# Patient Record
Sex: Male | Born: 1950
Health system: Southern US, Community
[De-identification: ages and names within clinical notes are randomized; demographics above are authoritative.]

## PROBLEM LIST (undated history)

## (undated) DIAGNOSIS — R0609 Other forms of dyspnea: Secondary | ICD-10-CM

## (undated) DIAGNOSIS — C61 Malignant neoplasm of prostate: Secondary | ICD-10-CM

## (undated) DIAGNOSIS — E669 Obesity, unspecified: Secondary | ICD-10-CM

## (undated) DIAGNOSIS — G4733 Obstructive sleep apnea (adult) (pediatric): Secondary | ICD-10-CM

## (undated) DIAGNOSIS — F32A Depression, unspecified: Secondary | ICD-10-CM

## (undated) DIAGNOSIS — I10 Essential (primary) hypertension: Secondary | ICD-10-CM

## (undated) DIAGNOSIS — R5383 Other fatigue: Secondary | ICD-10-CM

## (undated) DIAGNOSIS — R0600 Dyspnea, unspecified: Secondary | ICD-10-CM

## (undated) DIAGNOSIS — M1990 Unspecified osteoarthritis, unspecified site: Secondary | ICD-10-CM

## (undated) DIAGNOSIS — E119 Type 2 diabetes mellitus without complications: Secondary | ICD-10-CM

## (undated) DIAGNOSIS — R972 Elevated prostate specific antigen [PSA]: Secondary | ICD-10-CM

## (undated) DIAGNOSIS — R0789 Other chest pain: Secondary | ICD-10-CM

## (undated) DIAGNOSIS — F419 Anxiety disorder, unspecified: Secondary | ICD-10-CM

## (undated) DIAGNOSIS — Z889 Allergy status to unspecified drugs, medicaments and biological substances status: Secondary | ICD-10-CM

## (undated) DIAGNOSIS — N529 Male erectile dysfunction, unspecified: Secondary | ICD-10-CM

## (undated) HISTORY — DX: Other forms of dyspnea: R06.09

## (undated) HISTORY — PX: EYE SURGERY: SHX253

## (undated) HISTORY — DX: Anxiety disorder, unspecified: F41.9

## (undated) HISTORY — DX: Dyspnea, unspecified: R06.00

## (undated) HISTORY — DX: Male erectile dysfunction, unspecified: N52.9

## (undated) HISTORY — PX: PROSTATE BIOPSY: SHX241

## (undated) HISTORY — DX: Allergy status to unspecified drugs, medicaments and biological substances: Z88.9

## (undated) HISTORY — PX: TONSILLECTOMY: SUR1361

## (undated) HISTORY — DX: Obstructive sleep apnea (adult) (pediatric): G47.33

## (undated) HISTORY — DX: Elevated prostate specific antigen (PSA): R97.20

## (undated) HISTORY — DX: Other fatigue: R53.83

## (undated) HISTORY — DX: Obesity, unspecified: E66.9

## (undated) HISTORY — DX: Other chest pain: R07.89

## (undated) HISTORY — DX: Essential (primary) hypertension: I10

## (undated) HISTORY — PX: CATARACT EXTRACTION, BILATERAL: SHX1313

---

## 1993-01-18 HISTORY — PX: APPENDECTOMY: SHX54

## 1997-07-14 ENCOUNTER — Emergency Department (HOSPITAL_COMMUNITY): Admission: EM | Admit: 1997-07-14 | Discharge: 1997-07-14 | Payer: Self-pay | Admitting: Emergency Medicine

## 2003-11-11 ENCOUNTER — Ambulatory Visit (HOSPITAL_COMMUNITY): Admission: RE | Admit: 2003-11-11 | Discharge: 2003-11-11 | Payer: Self-pay | Admitting: Gastroenterology

## 2008-01-19 HISTORY — PX: EYE SURGERY: SHX253

## 2010-02-17 ENCOUNTER — Encounter: Payer: Self-pay | Admitting: Internal Medicine

## 2010-07-19 DIAGNOSIS — R972 Elevated prostate specific antigen [PSA]: Secondary | ICD-10-CM

## 2010-07-19 HISTORY — DX: Elevated prostate specific antigen (PSA): R97.20

## 2010-09-11 ENCOUNTER — Ambulatory Visit (HOSPITAL_BASED_OUTPATIENT_CLINIC_OR_DEPARTMENT_OTHER): Payer: BC Managed Care – PPO

## 2010-10-30 ENCOUNTER — Encounter (HOSPITAL_BASED_OUTPATIENT_CLINIC_OR_DEPARTMENT_OTHER): Payer: BC Managed Care – PPO

## 2010-11-06 ENCOUNTER — Ambulatory Visit (HOSPITAL_BASED_OUTPATIENT_CLINIC_OR_DEPARTMENT_OTHER): Payer: BC Managed Care – PPO | Attending: Family Medicine

## 2010-11-06 DIAGNOSIS — G4733 Obstructive sleep apnea (adult) (pediatric): Secondary | ICD-10-CM | POA: Insufficient documentation

## 2010-11-06 DIAGNOSIS — G4761 Periodic limb movement disorder: Secondary | ICD-10-CM | POA: Insufficient documentation

## 2010-11-14 DIAGNOSIS — G4761 Periodic limb movement disorder: Secondary | ICD-10-CM

## 2010-11-14 DIAGNOSIS — G4733 Obstructive sleep apnea (adult) (pediatric): Secondary | ICD-10-CM

## 2010-11-14 DIAGNOSIS — R0989 Other specified symptoms and signs involving the circulatory and respiratory systems: Secondary | ICD-10-CM

## 2010-11-14 DIAGNOSIS — R0609 Other forms of dyspnea: Secondary | ICD-10-CM

## 2010-11-14 NOTE — Procedures (Signed)
NAME:  Richard Pollard, Richard Pollard NO.:  [id number]  MEDICAL RECORD NO.:  [id number]          PATIENT TYPE:  OUT  LOCATION:  SLEEP CENTER                 FACILITY:  [facility]  PHYSICIAN:  Zarina Pe D. Maple Hudson, MD, FCCP, FACPDATE OF BIRTH:  1950-08-[birth year]  DATE OF STUDY:  11/06/2010                           NOCTURNAL POLYSOMNOGRAM  REFERRING PHYSICIAN:  Marjory Lies, M.D.  REFERRING DOCTOR:  Marjory Lies, MD  INDICATION FOR STUDY:  Hypersomnia with sleep apnea.  EPWORTH SLEEPINESS SCORE:  10/24.  BMI 35.9, weight 265 pounds, height 72 inches, neck 16 inches.  MEDICATIONS:  Home medications are charted and reviewed.  SLEEP ARCHITECTURE:  Total sleep time 364.5 minutes with sleep efficiency 80%.  Stage I was 26.1%.  Stage II 56.1%.  Stage III absent. REM 17.8% of total sleep time.  Sleep latency 9.5 minutes.  REM latency 120 minutes.  Awake after sleep onset 79 minutes.  Arousal index 25.  Bedtime Medication:  None.  RESPIRATORY DATA:  Apnea-hypopnea index (AHI) 16.6 per hour.  A total of 101 respiratory events were counted of which 40 were obstructive sleep apneas, 18 central apneas, 35 hypopneas.  Events were not positional. REM AHI 16.6 per hour.  There were insufficient numbers of early events to permit initiation of CPAP titration protocol on this study night. Technician observed significant apnea events particularly while lying supine.  OXYGEN DATA:  Very loud snoring with oxygen desaturation to a nadir of 88% and a mean oxygen saturation through the study of 93.8% on room air.  CARDIAC DATA:  Normal sinus rhythm.  MOVEMENT-PARASOMNIA:  Significant periodic limb movement with sleep disturbance.  A total of 341 limb jerks were counted of which 24 were associated with arousals or awakening for periodic limb movement with arousal index of 4 per hour.  No bathroom trips.  IMPRESSIONS-RECOMMENDATIONS: 1. Sleep was fragmented by frequent brief awakenings throughout the  study. 2. Moderate obstructive sleep apnea/hypopnea syndrome, AHI 16.6 per     hour.  Very loud snoring with oxygen desaturation to a nadir of 88%     and a mean oxygen saturation through the study of 93.8% on room     air. Consider return for CPAP titration. 3. Significant periodic limb movement with arousal syndrome.  A total     of 341 limb jerks were counted of which 24 were associated with     arousals or awakening for periodic limb movement with arousal index     of 4 per hour.  This pattern predominated until apnea events became     more common in the latter half of the night. It is likely both     forms of sleep disturbance will respond to treatment.     Lorine Iannaccone D. Maple Hudson, MD, [facility], FACP Diplomate, Biomedical engineer of Sleep Medicine Electronically Signed    CDY/MEDQ  D:  11/14/2010 11:24:08  T:  11/14/2010 12:41:56  Job:  [id number]

## 2011-01-01 ENCOUNTER — Encounter: Payer: Self-pay | Admitting: *Deleted

## 2011-01-01 ENCOUNTER — Encounter: Payer: Self-pay | Admitting: Internal Medicine

## 2011-01-01 ENCOUNTER — Ambulatory Visit (INDEPENDENT_AMBULATORY_CARE_PROVIDER_SITE_OTHER): Payer: BC Managed Care – PPO | Admitting: Internal Medicine

## 2011-01-01 DIAGNOSIS — R0789 Other chest pain: Secondary | ICD-10-CM

## 2011-01-01 DIAGNOSIS — R0989 Other specified symptoms and signs involving the circulatory and respiratory systems: Secondary | ICD-10-CM

## 2011-01-01 DIAGNOSIS — R0609 Other forms of dyspnea: Secondary | ICD-10-CM

## 2011-01-01 DIAGNOSIS — G4733 Obstructive sleep apnea (adult) (pediatric): Secondary | ICD-10-CM

## 2011-01-01 MED ORDER — FUROSEMIDE 20 MG PO TABS: ORAL_TABLET | ORAL | Status: DC

## 2011-01-01 MED ORDER — LISINOPRIL 20 MG PO TABS: 20.0000 mg | ORAL_TABLET | Freq: Every day | ORAL | Status: DC

## 2011-01-01 NOTE — Assessment & Plan Note (Addendum)
The patient has significant dyspnea on exertion that has been progressive over last 6-8 months. This is accompanied by peripheral edema. We'll likely discussion about the issues of heart failure with either preserved or depressed left ventricular function. From a therapeutic vantage point we will begin antihypertensive therapy with an ACE inhibitor and begin a diuretic using Lasix for about 2 weeks with the hope of the 5-10 weight loss. We have also discussed the importance of a low sodium diet  From a diagnostic point of view, understanding left ventricular function and the potential role ischemia is important with his family history and cardiac risk factors and will undertake echo and Myoview scanning his habitus being inadequate for stress echo.  I also think that it is important for him to pursue his therapy for sleep apnea at this point.

## 2011-01-01 NOTE — Assessment & Plan Note (Signed)
His chest tightness while somewhat atypical still is within intermediate-high pretest probability for coronary disease. Will undertake stress Myoview scanning

## 2011-01-01 NOTE — Assessment & Plan Note (Addendum)
I recommended that he pursue therapy for this in Parallel with the aforementioned evaluation

## 2011-01-01 NOTE — Progress Notes (Signed)
  HPI  Richard Pollard is a 60 y.o. male Seen at the request of Dr. Doristine Counter because of progressive dyspnea on exertion in the setting of peripheral edema.  He has no known history of heart disease. He has had some vague chest discomforts over the recent months that are primarily associated with mild or moderate degrees of exertion. They're unaccompanied by other epiphenomena.  He has also noted progressive dyspnea on exertion over the last 6 months please no limited to less than one flight of stairs. In the same interval he has had problems with peripheral edema. He has had no palpitations orthopnea or nocturnal dyspnea.  He's also been evaluated into currently for sleep apnea. His AHI was 17; therapy was recommended but initially she was deferred until this evaluation.  Over recent months he's also had problems with nausea and vomiting with nocturnal reflux symptoms i.e. Chest discomfort. There've also been vague episodes of weakness  Past Medical History  Diagnosis Date  . OSA (obstructive sleep apnea)   . Chest tightness   . Sleep disturbance   . Obesity   . Anxiety   . PSA elevation 07/2010  . Erectile dysfunction   . Fatigue   . Constipation   . Multiple allergies   . Breathing problem     Past Surgical History  Procedure Date  . Appendectomy 1995  . Eye surgery 2010  . Tonsillectomy     childhood    Current Outpatient Prescriptions  Medication Sig Dispense Refill  . aspirin 325 MG tablet Take 325 mg by mouth daily.        . fish oil-omega-3 fatty acids 1000 MG capsule Take 2 g by mouth daily.        Marland Kitchen FLUTICASONE PROPIONATE  HFA IN Inhale into the lungs daily.        . Misc Natural Products (GLUCOSAMINE CHOND COMPLEX/MSM) TABS Take 1 tablet by mouth daily.        . Multiple Vitamins-Minerals (MULTIVITAMIN WITH MINERALS) tablet Take 1 tablet by mouth daily.        . sertraline (ZOLOFT) 25 MG tablet Take 25 mg by mouth daily.        . Vardenafil HCl (LEVITRA PO) Take by  mouth as needed.        . vitamin E 1000 UNIT capsule Take 1,000 Units by mouth daily.         Family History  Problem Relation Age of Onset  . Coronary artery disease    . Heart attack Father 39    after Bypass surgery at age 66  . 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

## 2011-01-01 NOTE — Patient Instructions (Signed)
Your physician has recommended you make the following change in your medication:  1) Take lasix (furosemide) 20 mg one tablet daily for 2 weeks, then stop. 2) Start lisinopril 20 mg one tablet daily.  Your physician has requested that you have en exercise stress myoview. For further information please visit [email or web address] Please follow instruction sheet, as given.  Your physician has requested that you have an echocardiogram. Echocardiography is a painless test that uses sound waves to create images of your heart. It provides your doctor with information about the size and shape of your heart and how well your heart's chambers and valves are working. This procedure takes approximately one hour. There are no restrictions for this procedure.  Your physician recommends that you schedule a follow-up appointment in: 4 weeks.

## 2011-01-07 ENCOUNTER — Ambulatory Visit (HOSPITAL_BASED_OUTPATIENT_CLINIC_OR_DEPARTMENT_OTHER): Payer: BC Managed Care – PPO | Admitting: Radiology

## 2011-01-07 ENCOUNTER — Ambulatory Visit (HOSPITAL_COMMUNITY): Payer: BC Managed Care – PPO | Attending: Internal Medicine | Admitting: Radiology

## 2011-01-07 DIAGNOSIS — Z8249 Family history of ischemic heart disease and other diseases of the circulatory system: Secondary | ICD-10-CM | POA: Insufficient documentation

## 2011-01-07 DIAGNOSIS — R0609 Other forms of dyspnea: Secondary | ICD-10-CM

## 2011-01-07 DIAGNOSIS — I10 Essential (primary) hypertension: Secondary | ICD-10-CM | POA: Insufficient documentation

## 2011-01-07 DIAGNOSIS — R0789 Other chest pain: Secondary | ICD-10-CM

## 2011-01-07 DIAGNOSIS — R0602 Shortness of breath: Secondary | ICD-10-CM

## 2011-01-07 DIAGNOSIS — G4733 Obstructive sleep apnea (adult) (pediatric): Secondary | ICD-10-CM

## 2011-01-07 DIAGNOSIS — E669 Obesity, unspecified: Secondary | ICD-10-CM | POA: Insufficient documentation

## 2011-01-07 DIAGNOSIS — Z87891 Personal history of nicotine dependence: Secondary | ICD-10-CM | POA: Insufficient documentation

## 2011-01-07 DIAGNOSIS — R079 Chest pain, unspecified: Secondary | ICD-10-CM

## 2011-01-07 DIAGNOSIS — R0989 Other specified symptoms and signs involving the circulatory and respiratory systems: Secondary | ICD-10-CM | POA: Insufficient documentation

## 2011-01-07 MED ORDER — TECHNETIUM TC 99M TETROFOSMIN IV KIT
10.0000 | PACK | Freq: Once | INTRAVENOUS | Status: AC | PRN
  Administered 2011-01-07: 10 via INTRAVENOUS

## 2011-01-07 MED ORDER — TECHNETIUM TC 99M TETROFOSMIN IV KIT
30.0000 | PACK | Freq: Once | INTRAVENOUS | Status: AC | PRN
  Administered 2011-01-07: 30 via INTRAVENOUS

## 2011-01-07 NOTE — Progress Notes (Signed)
[facility] SITE 3 NUCLEAR MED [address] [place] [address] [phone]  Cardiology Nuclear Med Study  CHUKWUEBUKA CHURCHILL is a 60 y.o. male [id number] 11-06-1950   Nuclear Med Background Indication for Stress Test:  Evaluation for Ischemia History:  No previous documented CAD Cardiac Risk Factors: Family History - CAD, History of Smoking, Hypertension and Obesity  Symptoms:  Chest Tightness (last episode of chest discomfort was about one week ago) and DOE   Nuclear Pre-Procedure Caffeine/Decaff Intake:  None NPO After: 8:00am   Lungs:  Clear.  O2 SAT 98% on RA IV 0.9% NS with Angio Cath:  22g  IV Site: R Hand  IV Started by:  Stanton Kidney, EMT-P  Chest Size (in):  48 Cup Size: n/a  Height: 6' (1.829 m)  Weight:  282 lb (127.914 kg)  BMI:  Body mass index is 38.25 kg/(m^2). Tech Comments:  AM medications taken today, per patient.    Nuclear Med Study 1 or 2 day study: 2 day  Stress Test Type:  Stress  Reading MD: Olga Millers, MD  Order Authorizing Provider:  Berton Mount, MD  Resting Radionuclide: Technetium 59m Tetrofosmin  Resting Radionuclide Dose: 11.0 mCi   Stress Radionuclide:  Technetium 66m Tetrofosmin  Stress Radionuclide Dose: 33.0 mCi           Stress Protocol Rest HR: 71 Stress HR: 151  Rest BP: Sitting:135/93  Standing:137/84 Stress BP: 238/80  Exercise Time (min): 5:46 METS: 7.0   Predicted Max HR: 162 bpm % Max HR: 93.21 bpm Rate Pressure Product: 46962   Dose of Adenosine (mg):  n/a Dose of Lexiscan: n/a mg  Dose of Atropine (mg): n/a Dose of Dobutamine: n/a mcg/kg/min (at max HR)  Stress Test Technologist: Smiley Houseman, CMA-N  Nuclear Technologist:  Domenic Polite, CNMT     Rest Procedure:  Myocardial perfusion imaging was performed at rest 45 minutes following the intravenous administration of Technetium 72m Tetrofosmin.Marland Kitchen  Rest ECG: No acute changes.  Stress Procedure:  The patient exercised for 5:46 on the treadmill  utilizing the Bruce protocol.  The patient stopped due to significant dyspnea and denied any chest pain.  There were no diagnostic ST-T wave changes, only rare PVC's and PAC's.  He did have a hypertensive response to exercise, 238/80.  Technetium 15m Tetrofosmin was injected at peak exercise and myocardial perfusion imaging was performed after a brief delay.  Stress ECG: No significant ST segment change suggestive of ischemia.  QPS Raw Data Images:  Acquisition technically good; normal left ventricular size. Stress Images:  Normal homogeneous uptake in all areas of the myocardium. Rest Images:  Normal homogeneous uptake in all areas of the myocardium. Subtraction (SDS):  No evidence of ischemia. Transient Ischemic Dilatation (Normal <1.22):  1.05 Lung/Heart Ratio (Normal <0.45):  0.28  Quantitative Gated Spect Images QGS EDV:  92 ml QGS ESV:  31 ml QGS cine images:  NL LV Function; NL Wall Motion QGS EF: 66%  Impression Exercise Capacity:  Fair exercise capacity. BP Response:  Hypertensive blood pressure response (? Decreased BP in stage 2 but felt most likely not to be accurate). Clinical Symptoms:  No chest pain. ECG Impression:  No significant ST segment change suggestive of ischemia. Comparison with Prior Nuclear Study: No previous nuclear study performed  Overall Impression:  Normal stress nuclear study.   Olga Millers

## 2011-01-26 ENCOUNTER — Telehealth: Payer: Self-pay | Admitting: *Deleted

## 2011-01-26 NOTE — Telephone Encounter (Signed)
I called the patient with his echo and myoview results. He states that he still has SOB. He was given a prescription by Dr. Graciela Husbands to try lasix 20mg  once daily for ankle edema. He states he is still on this and has noticed no change in the amount of edema or in the amount of urination. I informed him I would review with Dr. Graciela Husbands to see if he recommends increasing the dose of lasix to 40 mg daily to see if he notices an improvement with this. He has a follow up scheduled with Dr. Graciela Husbands for 02/16/11 at 4pm. I will call him back.

## 2011-01-29 NOTE — Telephone Encounter (Signed)
I left a message for the patient to call. 

## 2011-01-29 NOTE — Telephone Encounter (Signed)
I agree. The echo and the Myoview were normal. Salt restriction and increasing diuretics would be helpful. Let's try 40 daily for a week. If that does not help he can increase to 60 mg a day in anticipation of seeing [place]

## 2011-02-04 NOTE — Telephone Encounter (Signed)
I spoke with the patient. He is having some intermittent sob. He will increase lasix to 40 mg daily for a week per Dr. Odessa Fleming recommendations, and up to 60 mg a day if that does not help. We will see him back on 02/16/11. He is agreeable and voices understanding.

## 2011-02-16 ENCOUNTER — Encounter: Payer: Self-pay | Admitting: Internal Medicine

## 2011-02-16 ENCOUNTER — Ambulatory Visit (INDEPENDENT_AMBULATORY_CARE_PROVIDER_SITE_OTHER): Payer: BC Managed Care – PPO | Admitting: Internal Medicine

## 2011-02-16 DIAGNOSIS — I110 Hypertensive heart disease with heart failure: Secondary | ICD-10-CM | POA: Insufficient documentation

## 2011-02-16 DIAGNOSIS — R609 Edema, unspecified: Secondary | ICD-10-CM

## 2011-02-16 DIAGNOSIS — R0609 Other forms of dyspnea: Secondary | ICD-10-CM

## 2011-02-16 DIAGNOSIS — G4733 Obstructive sleep apnea (adult) (pediatric): Secondary | ICD-10-CM

## 2011-02-16 DIAGNOSIS — R0989 Other specified symptoms and signs involving the circulatory and respiratory systems: Secondary | ICD-10-CM

## 2011-02-16 DIAGNOSIS — I10 Essential (primary) hypertension: Secondary | ICD-10-CM

## 2011-02-16 MED ORDER — LOSARTAN POTASSIUM 50 MG PO TABS: 50.0000 mg | ORAL_TABLET | Freq: Every day | ORAL | Status: DC

## 2011-02-16 MED ORDER — FUROSEMIDE 40 MG PO TABS: ORAL_TABLET | ORAL | Status: DC

## 2011-02-16 NOTE — Assessment & Plan Note (Signed)
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 We will augment his diuresis initially  To 40 mg daily and then to 80 mg as necessary. He will followup with Dr. Doristine Counter within the next couple of weeks following the in the inception of urination . He will need a metabolic profile at that time

## 2011-02-16 NOTE — Assessment & Plan Note (Signed)
I have encouraged him to follow up again with Dr. Doristine Counter to pursue evaluation of sleep apnea.

## 2011-02-16 NOTE — Progress Notes (Signed)
  HPI  Richard Pollard is a 61 y.o. male Seen in followup for shortness of breath. He underwent a noninvasive evaluation since his last visit in December included an ultrasound demonstrating normal systolic function and mild diastolic abnormalities and otherwise a structurally normal heart and a Myoview scan that was normal.  His breathing was somewhat better with antihypertensive therapy; however, he developed a dry cough. The diuretics did not augment urination.  Past Medical History  Diagnosis Date  . OSA (obstructive sleep apnea)   . Chest tightness   . Dyspnea on exertion   . Obesity   . Anxiety /Depression-mild   . PSA elevation 07/2010  . Erectile dysfunction   . Fatigue   . Constipation   . Multiple allergies     Past Surgical History  Procedure Date  . Appendectomy 1995  . Eye surgery 2010  . Tonsillectomy     childhood    Current Outpatient Prescriptions  Medication Sig Dispense Refill  . aspirin 325 MG tablet Take 325 mg by mouth daily.        . fish oil-omega-3 fatty acids 1000 MG capsule Take 2 g by mouth daily.        Marland Kitchen FLUTICASONE PROPIONATE  HFA IN Inhale into the lungs daily.        . furosemide (LASIX) 20 MG tablet Take one tablet by mouth once daily as directed  30 tablet  0  . lisinopril (PRINIVIL,ZESTRIL) 20 MG tablet Take 1 tablet (20 mg total) by mouth daily.  90 tablet  3  . Misc Natural Products (GLUCOSAMINE CHOND COMPLEX/MSM) TABS Take 1 tablet by mouth daily.        . Multiple Vitamins-Minerals (MULTIVITAMIN WITH MINERALS) tablet Take 1 tablet by mouth daily.        . sertraline (ZOLOFT) 25 MG tablet Take 25 mg by mouth daily.        . Vardenafil HCl (LEVITRA PO) Take by mouth as needed.          No Known Allergies  Review of Systems negative except from HPI and PMH  Physical Exam BP 135/90  Pulse 90  Ht 6' (1.829 m)  Wt 288 lb (130.636 kg)  BMI 39.06 kg/m2 Well developed and well nourished in no acute distress HENT normal E scleral and  icterus clear Neck Supple JVP 6-7 cm; carotids brisk and full Clear to ausculation Regular rate and rhythm, no murmurs gallops or rub Soft with active bowel sounds No clubbing cyanosis 1+ Edema Alert and oriented, grossly normal motor and sensory function Skin Warm and Dry   Assessment and  Plan

## 2011-02-16 NOTE — Assessment & Plan Note (Signed)
Will change his ACE inhibitor to an ARB because of the cough. We will use a diuretic also to help. He'll follow up with his PCP

## 2011-02-16 NOTE — Assessment & Plan Note (Signed)
As above.

## 2011-02-16 NOTE — Patient Instructions (Signed)
Your physician has recommended you make the following change in your medication:  1) Increase lasix to 40 mg one tablet by mouth twice daily. 2) Stop lisinopril. 3) Start losartan (cozaar) 50 mg one tablet by mouth daily.  We will see you back as needed.

## 2011-03-17 ENCOUNTER — Telehealth: Payer: Self-pay | Admitting: Internal Medicine

## 2011-03-17 NOTE — Telephone Encounter (Signed)
All Cardiac faxed to [facility] @ [phone] 03/17/11/KM

## 2011-04-23 ENCOUNTER — Ambulatory Visit (HOSPITAL_BASED_OUTPATIENT_CLINIC_OR_DEPARTMENT_OTHER): Payer: BC Managed Care – PPO | Attending: Family Medicine | Admitting: Radiology

## 2011-04-23 VITALS — Ht 72.0 in | Wt 280.0 lb

## 2011-04-23 DIAGNOSIS — G4733 Obstructive sleep apnea (adult) (pediatric): Secondary | ICD-10-CM

## 2011-04-23 DIAGNOSIS — G4730 Sleep apnea, unspecified: Secondary | ICD-10-CM | POA: Insufficient documentation

## 2011-04-23 DIAGNOSIS — G4761 Periodic limb movement disorder: Secondary | ICD-10-CM | POA: Insufficient documentation

## 2011-04-23 DIAGNOSIS — G4710 Hypersomnia, unspecified: Secondary | ICD-10-CM | POA: Insufficient documentation

## 2011-05-01 DIAGNOSIS — G4730 Sleep apnea, unspecified: Secondary | ICD-10-CM

## 2011-05-01 DIAGNOSIS — G4710 Hypersomnia, unspecified: Secondary | ICD-10-CM

## 2011-05-01 DIAGNOSIS — G4761 Periodic limb movement disorder: Secondary | ICD-10-CM

## 2011-05-01 NOTE — Procedures (Signed)
NAME:  Richard Pollard, Richard Pollard NO.:  [id number]  MEDICAL RECORD NO.:  [id number]          PATIENT TYPE:  OUT  LOCATION:  SLEEP CENTER                 FACILITY:  [facility]  PHYSICIAN:  Treydon Henricks D. Maple Hudson, MD, FCCP, FACPDATE OF BIRTH:  1950-07-20  DATE OF STUDY:  04/23/2011                           NOCTURNAL POLYSOMNOGRAM  REFERRING PHYSICIAN:  Marjory Lies, M.D.  INDICATION FOR STUDY:  Hypersomnia with sleep apnea.  EPWORTH SLEEPINESS SCORE:  7/24.  BMI 38, weight 280 pounds.  Height 72 inches, neck 17 inches.  MEDICATIONS:  Home medications are charted and reviewed.  A baseline diagnostic NPSG on November 06, 2010, recorded AHI of 16.5 per hour.  CPAP titration is requested.  SLEEP ARCHITECTURE:  Total sleep time 378.5 minutes with sleep efficiency 94.7%.  Stage I was 4.5%, stage II 52.7%, stage III absent, REM 42.8% of total sleep time.  Sleep latency 5.5 minutes, REM latency 94.5 minutes.  Awake after sleep onset 16 minutes.  Arousal index 5.2.  Bedtime medication:  None.  RESPIRATORY DATA:  CPAP titration protocol.  CPAP was titrated to 13 CWP, AHI 0.6 per hour.  He wore a wide ResMed Mirage FX mask with heated humidifier.  Snoring was prevented and mean oxygen saturation held 94.1% on room air.  OXYGEN DATA:  CARDIAC DATA:  Normal sinus rhythm.  MOVEMENT-PARASOMNIA:  Frequent limb jerks.  A total of 643 limb jerks were counted of which 13 were associated with arousal or awakening for periodic limb movement arousal index of 2.1 per hour.  No bathroom trips.  IMPRESSIONS-RECOMMENDATIONS: 1. Successful CPAP titration to 13 CWP, AHI 0.6 per hour.  He wore a     wide ResMed Mirage FX mask with heated humidifier.  Snoring was     prevented and mean oxygen saturation held 94.1% on room air. 2. Baseline diagnostic NPSG on November 06, 2010, recorded AHI 16.5 per     hour. 3. Periodic limb movements with arousal.  A total of 643 limb jerks     were counted of which  only 13 were associated with arousal or     awakening for periodic limb movement with arousal index of 2.1 per     hour.  If this     pattern persists as a cause of sleep disturbance in the home     environment after adjustment of CPAP, then specific therapy such as     a trial of Requip or Mirapex might be considered if clinically     appropriate.     Leodan Bolyard D. Maple Hudson, MD, [facility], FACP Diplomate, American Board of Sleep Medicine    CDY/MEDQ  D:  05/01/2011 11:05:42  T:  05/01/2011 13:05:14  Job:  [id number]

## 2012-04-04 ENCOUNTER — Other Ambulatory Visit: Payer: Self-pay | Admitting: Internal Medicine

## 2012-06-11 ENCOUNTER — Emergency Department (HOSPITAL_COMMUNITY): Payer: BC Managed Care – PPO

## 2012-06-11 ENCOUNTER — Emergency Department (HOSPITAL_COMMUNITY)
Admission: EM | Admit: 2012-06-11 | Discharge: 2012-06-11 | Disposition: A | Discharge status: Home / Self Care | Payer: BC Managed Care – PPO | Attending: Emergency Medicine | Admitting: Emergency Medicine

## 2012-06-11 ENCOUNTER — Encounter (HOSPITAL_COMMUNITY): Payer: Self-pay | Admitting: *Deleted

## 2012-06-11 DIAGNOSIS — S43004A Unspecified dislocation of right shoulder joint, initial encounter: Secondary | ICD-10-CM

## 2012-06-11 DIAGNOSIS — Z87448 Personal history of other diseases of urinary system: Secondary | ICD-10-CM | POA: Insufficient documentation

## 2012-06-11 DIAGNOSIS — Y9289 Other specified places as the place of occurrence of the external cause: Secondary | ICD-10-CM | POA: Insufficient documentation

## 2012-06-11 DIAGNOSIS — Z8719 Personal history of other diseases of the digestive system: Secondary | ICD-10-CM | POA: Insufficient documentation

## 2012-06-11 DIAGNOSIS — F329 Major depressive disorder, single episode, unspecified: Secondary | ICD-10-CM | POA: Insufficient documentation

## 2012-06-11 DIAGNOSIS — Z79899 Other long term (current) drug therapy: Secondary | ICD-10-CM | POA: Insufficient documentation

## 2012-06-11 DIAGNOSIS — G4733 Obstructive sleep apnea (adult) (pediatric): Secondary | ICD-10-CM | POA: Insufficient documentation

## 2012-06-11 DIAGNOSIS — Y9389 Activity, other specified: Secondary | ICD-10-CM | POA: Insufficient documentation

## 2012-06-11 DIAGNOSIS — X500XXA Overexertion from strenuous movement or load, initial encounter: Secondary | ICD-10-CM | POA: Insufficient documentation

## 2012-06-11 DIAGNOSIS — F3289 Other specified depressive episodes: Secondary | ICD-10-CM | POA: Insufficient documentation

## 2012-06-11 DIAGNOSIS — S43016A Anterior dislocation of unspecified humerus, initial encounter: Secondary | ICD-10-CM | POA: Insufficient documentation

## 2012-06-11 DIAGNOSIS — F411 Generalized anxiety disorder: Secondary | ICD-10-CM | POA: Insufficient documentation

## 2012-06-11 DIAGNOSIS — E669 Obesity, unspecified: Secondary | ICD-10-CM | POA: Insufficient documentation

## 2012-06-11 DIAGNOSIS — IMO0002 Reserved for concepts with insufficient information to code with codable children: Secondary | ICD-10-CM | POA: Insufficient documentation

## 2012-06-11 DIAGNOSIS — Z8679 Personal history of other diseases of the circulatory system: Secondary | ICD-10-CM | POA: Insufficient documentation

## 2012-06-11 DIAGNOSIS — Z7982 Long term (current) use of aspirin: Secondary | ICD-10-CM | POA: Insufficient documentation

## 2012-06-11 MED ORDER — OXYCODONE-ACETAMINOPHEN 5-325 MG PO TABS: 2.0000 | ORAL_TABLET | ORAL | Status: DC | PRN

## 2012-06-11 MED ORDER — HYDROMORPHONE HCL PF 1 MG/ML IJ SOLN
1.0000 mg | Freq: Once | INTRAMUSCULAR | Status: AC
  Administered 2012-06-11: 1 mg via INTRAVENOUS
  Filled 2012-06-11: qty 1

## 2012-06-11 MED ORDER — PROPOFOL 10 MG/ML IV BOLUS
INTRAVENOUS | Status: AC | PRN
  Administered 2012-06-11: 10 mg via INTRAVENOUS

## 2012-06-11 MED ORDER — KETAMINE HCL 10 MG/ML IJ SOLN: 100.0000 mg | Freq: Once | INTRAMUSCULAR | Status: AC

## 2012-06-11 MED ORDER — METHOCARBAMOL 750 MG PO TABS: 750.0000 mg | ORAL_TABLET | Freq: Four times a day (QID) | ORAL | Status: DC

## 2012-06-11 MED ORDER — PROPOFOL 10 MG/ML IV BOLUS
100.0000 mg | Freq: Once | INTRAVENOUS | Status: AC
  Administered 2012-06-11: 5.5 mg via INTRAVENOUS
  Filled 2012-06-11: qty 1

## 2012-06-11 MED ORDER — KETAMINE HCL 10 MG/ML IJ SOLN
INTRAMUSCULAR | Status: AC | PRN
  Administered 2012-06-11: 10 mg via INTRAVENOUS

## 2012-06-11 NOTE — ED Notes (Signed)
Pt presents to ed with c/o shoulder injury/ pt sts smashed his shoulder between golf cart and roof support that he was working on. There is obvious deformity to right shoulder.

## 2012-06-11 NOTE — ED Provider Notes (Signed)
History     CSN: [id number]  Arrival date & time 06/11/12  1714   First MD Initiated Contact with Patient 06/11/12 1722      Chief Complaint  Patient presents with  . Shoulder Injury    (Consider location/radiation/quality/duration/timing/severity/associated sxs/prior treatment) Patient is a 62 y.o. male presenting with shoulder injury. The history is provided by the patient.  Shoulder Injury   patient complains of right shoulder injury after hyperextending it. Unable to move his arm at this time without severe sharp pain. Denies any numbness to his hand. No prior history of shoulder dislocation. Symptoms better with remaining still and worse with movement. No treatment used prior to arrival  Past Medical History  Diagnosis Date  . OSA (obstructive sleep apnea)   . Chest tightness   . Dyspnea on exertion   . Obesity   . Anxiety /Depression-mild   . PSA elevation 07/2010  . Erectile dysfunction   . Fatigue   . Constipation   . Multiple allergies     Past Surgical History  Procedure Laterality Date  . Appendectomy  1995  . Eye surgery  2010  . Tonsillectomy      childhood    Family History  Problem Relation Age of Onset  . Coronary artery disease    . Heart attack Father 66    after Bypass surgery at age 78  . Other Mother 93    angioplasty    History  Substance Use Topics  . Smoking status: Never Smoker   . Smokeless tobacco: Never Used  . Alcohol Use: Yes      Review of Systems  All other systems reviewed and are negative.    Allergies  Review of patient's allergies indicates no known allergies.  Home Medications   Current Outpatient Rx  Name  Route  Sig  Dispense  Refill  . aspirin 325 MG tablet   Oral   Take 325 mg by mouth daily.           . fish oil-omega-3 fatty acids 1000 MG capsule   Oral   Take 2 g by mouth daily.           Marland Kitchen FLUTICASONE PROPIONATE  HFA IN   Inhalation   Inhale into the lungs daily.           .  furosemide (LASIX) 40 MG tablet      Take one tablet by mouth twice daily.   180 tablet   3   . losartan (COZAAR) 50 MG tablet      TAKE 1 TABLET BY MOUTH DAILY   90 tablet   3   . Misc Natural Products (GLUCOSAMINE CHOND COMPLEX/MSM) TABS   Oral   Take 1 tablet by mouth daily.           . Multiple Vitamins-Minerals (MULTIVITAMIN WITH MINERALS) tablet   Oral   Take 1 tablet by mouth daily.           . sertraline (ZOLOFT) 25 MG tablet   Oral   Take 25 mg by mouth daily.           . Vardenafil HCl (LEVITRA PO)   Oral   Take by mouth as needed.             There were no vitals taken for this visit.  Physical Exam  Nursing note and vitals reviewed. Constitutional: He is oriented to person, place, and time. He appears well-developed and well-nourished.  Non-toxic appearance.  No distress.  HENT:  Head: Normocephalic and atraumatic.  Eyes: Conjunctivae, EOM and lids are normal. Pupils are equal, round, and reactive to light.  Neck: Normal range of motion. Neck supple. No tracheal deviation present. No mass present.  Cardiovascular: Normal rate, regular rhythm and normal heart sounds.  Exam reveals no gallop.   No murmur heard. Pulmonary/Chest: Effort normal and breath sounds normal. No stridor. No respiratory distress. He has no decreased breath sounds. He has no wheezes. He has no rhonchi. He has no rales.  Abdominal: Soft. Normal appearance and bowel sounds are normal. He exhibits no distension. There is no tenderness. There is no rebound and no CVA tenderness.  Musculoskeletal: He exhibits no edema and no tenderness.       Right shoulder: He exhibits decreased range of motion, tenderness and bony tenderness.       Arms: Neurological: He is alert and oriented to person, place, and time. He has normal strength. No cranial nerve deficit or sensory deficit. GCS eye subscore is 4. GCS verbal subscore is 5. GCS motor subscore is 6.  Skin: Skin is warm and dry. No abrasion  and no rash noted.  Psychiatric: He has a normal mood and affect. His speech is normal and behavior is normal.    ED Course  procedural sedation Date/Time: 06/11/2012 7:08 PM Performed by: Toy Baker Authorized by: Lorre Nick T Consent: Verbal consent obtained. written consent obtained. Risks and benefits: risks, benefits and alternatives were discussed Consent given by: patient Patient understanding: patient states understanding of the procedure being performed Patient identity confirmed: verbally with patient and arm band Time out: Immediately prior to procedure a "time out" was called to verify the correct patient, procedure, equipment, support staff and site/side marked as required. Patient sedated: yes Sedation type: moderate (conscious) sedation Sedatives: ketamine and propofol Sedation start date/time: 06/11/2012 6:40 PM Sedation end date/time: 06/11/2012 7:00 PM Patient tolerance: Patient tolerated the procedure well with no immediate complications.  Reduction of dislocation Date/Time: 06/11/2012 7:10 PM Performed by: Toy Baker Authorized by: Lorre Nick T Consent: Verbal consent obtained. written consent obtained. Consent given by: patient Patient understanding: patient states understanding of the procedure being performed Patient identity confirmed: verbally with patient and arm band Time out: Immediately prior to procedure a "time out" was called to verify the correct patient, procedure, equipment, support staff and site/side marked as required. Patient tolerance: Patient tolerated the procedure well with no immediate complications. Comments: Shoulder was reduced using traction   (including critical care time)  Labs Reviewed - No data to display No results found.   No diagnosis found.    MDM  7:55 PM  pts shoulder reduced, he has recovered from his sedation       Toy Baker, MD 06/11/12 Corky Crafts

## 2012-06-11 NOTE — ED Notes (Signed)
Patient is alert and oriented x3.  He was given DC instructions and follow up visit instructions.  Patient gave verbal understanding.  He was DC ambulatory under his own power to home.  V/S stable.  He was not showing any signs of distress on DC 

## 2012-10-09 DIAGNOSIS — Z947 Corneal transplant status: Secondary | ICD-10-CM | POA: Insufficient documentation

## 2012-10-09 HISTORY — DX: Corneal transplant status: Z94.7

## 2013-05-07 ENCOUNTER — Other Ambulatory Visit (HOSPITAL_COMMUNITY): Payer: Self-pay | Admitting: Urology

## 2013-05-07 DIAGNOSIS — R972 Elevated prostate specific antigen [PSA]: Secondary | ICD-10-CM

## 2013-05-25 ENCOUNTER — Ambulatory Visit (HOSPITAL_COMMUNITY)
Admission: RE | Admit: 2013-05-25 | Discharge: 2013-05-25 | Disposition: A | Discharge status: Home / Self Care | Payer: BC Managed Care – PPO | Source: Ambulatory Visit | Attending: Urology | Admitting: Urology

## 2013-06-01 ENCOUNTER — Ambulatory Visit (HOSPITAL_COMMUNITY): Payer: BC Managed Care – PPO

## 2013-06-06 ENCOUNTER — Ambulatory Visit (HOSPITAL_COMMUNITY)
Admission: RE | Admit: 2013-06-06 | Discharge: 2013-06-06 | Disposition: A | Discharge status: Home / Self Care | Payer: BC Managed Care – PPO | Source: Ambulatory Visit | Attending: Urology | Admitting: Urology

## 2013-06-06 DIAGNOSIS — R972 Elevated prostate specific antigen [PSA]: Secondary | ICD-10-CM | POA: Insufficient documentation

## 2013-06-06 LAB — CREATININE, SERUM
CREATININE: 1.21 mg/dL (ref 0.50–1.35)
GFR calc non Af Amer: 63 mL/min — ABNORMAL LOW (ref >90)
GFR, EST AFRICAN AMERICAN: 73 mL/min — AB (ref >90)

## 2013-06-06 MED ORDER — GADOBENATE DIMEGLUMINE 529 MG/ML IV SOLN
20.0000 mL | Freq: Once | INTRAVENOUS | Status: AC | PRN
  Administered 2013-06-06: 20 mL via INTRAVENOUS

## 2015-03-12 DIAGNOSIS — R972 Elevated prostate specific antigen [PSA]: Secondary | ICD-10-CM | POA: Insufficient documentation

## 2015-03-12 DIAGNOSIS — G4700 Insomnia, unspecified: Secondary | ICD-10-CM | POA: Insufficient documentation

## 2015-03-12 DIAGNOSIS — B360 Pityriasis versicolor: Secondary | ICD-10-CM | POA: Insufficient documentation

## 2015-03-12 DIAGNOSIS — J309 Allergic rhinitis, unspecified: Secondary | ICD-10-CM | POA: Insufficient documentation

## 2015-03-12 DIAGNOSIS — F320 Major depressive disorder, single episode, mild: Secondary | ICD-10-CM | POA: Insufficient documentation

## 2015-03-12 DIAGNOSIS — E669 Obesity, unspecified: Secondary | ICD-10-CM | POA: Insufficient documentation

## 2015-03-12 DIAGNOSIS — Z0000 Encounter for general adult medical examination without abnormal findings: Secondary | ICD-10-CM | POA: Insufficient documentation

## 2015-03-12 DIAGNOSIS — N529 Male erectile dysfunction, unspecified: Secondary | ICD-10-CM | POA: Insufficient documentation

## 2015-03-12 DIAGNOSIS — C61 Malignant neoplasm of prostate: Secondary | ICD-10-CM | POA: Insufficient documentation

## 2015-03-12 DIAGNOSIS — J342 Deviated nasal septum: Secondary | ICD-10-CM | POA: Insufficient documentation

## 2015-03-12 DIAGNOSIS — R7301 Impaired fasting glucose: Secondary | ICD-10-CM | POA: Insufficient documentation

## 2015-03-12 HISTORY — DX: Pityriasis versicolor: B36.0

## 2015-04-10 ENCOUNTER — Encounter: Payer: Self-pay | Admitting: Allergy and Immunology

## 2015-04-10 ENCOUNTER — Ambulatory Visit (INDEPENDENT_AMBULATORY_CARE_PROVIDER_SITE_OTHER): Payer: BLUE CROSS/BLUE SHIELD | Admitting: Allergy and Immunology

## 2015-04-10 VITALS — BP 114/78 | HR 90 | Temp 98.0°F | Resp 18 | Ht 71.85 in | Wt 265.4 lb

## 2015-04-10 DIAGNOSIS — R059 Cough, unspecified: Secondary | ICD-10-CM

## 2015-04-10 DIAGNOSIS — R05 Cough: Secondary | ICD-10-CM

## 2015-04-10 DIAGNOSIS — J310 Chronic rhinitis: Secondary | ICD-10-CM | POA: Diagnosis not present

## 2015-04-10 MED ORDER — AZELASTINE-FLUTICASONE 137-50 MCG/ACT NA SUSP: NASAL | Status: DC

## 2015-04-10 NOTE — Progress Notes (Signed)
NEW PATIENT NOTE  RE: DONIS MCAULEY MRN: [id number] DOB: 1950/08/11 [facility] [place] [address] [place] [address] Date of Office Visit: 04/10/2015  Dear Stephens Shire, MD:  I had the pleasure of seeing Richard Pollard today in initial evaluation as you recall-- Subjective:  Richard Pollard is a 65 y.o. male who presents today for Nasal Congestion and Cough  Assessment:   1. Cough,clear lung exam and normal in office spirometry, likely secondary to post-nasal drip.   2. Mixed rhinitis---allergic and non-allergic component.   3.      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 Plan:   Meds ordered this encounter  Medications  . Azelastine-Fluticasone 137-50 MCG/ACT SUSP    Sig: One Spray into the nostril twice daily for stuffy nose or drainage.    Dispense:  23 g    Refill:  5   Patient Instructions  1. Avoidance: Mite and Mold 2. Nasal Spray: Dymista one spray(s) each nostril twice daily for stuffy nose or drainage.  3. Nasal Saline wash prior to Dymista and as needed. 4. Follow up Visit:  6 weeks or sooner if needed.  HPI: Richard Pollard presents to the office with a recurring history since childhood of year round rhinorrhea, congestion, post nasal drip which is more intense each winter and spring, especially when the weather is very cool.  He also describes pollen, outdoors and fluctuant weather patterns are provoking factors to his symptoms.  No known food sensitivities, history of reflux or sinus infections.  He did have evaluation with ENT, Dr. Erik Obey last year and subsequently gave trial of Flonase and Nasacort and several oral antihistamines with only partial relief. He notices occasional thick drainage but no discolored drainage, fever, headache or sore throat.  Denies ED or Urgent care visits, prednisone or antibiotic courses.  Medical History: Past Medical History  Diagnosis Date  . OSA (obstructive sleep apnea)   . Chest tightness   . Dyspnea on  exertion   . Obesity   . Anxiety /Depression-mild   . PSA elevation 07/2010  . Erectile dysfunction   . Fatigue   . Constipation   . Multiple allergies    Surgical History: Past Surgical History  Procedure Laterality Date  . Appendectomy  1995  . Eye surgery  2010  . Tonsillectomy      childhood   Family History: Family History  Problem Relation Age of Onset  . Coronary artery disease    . Heart attack Father 46    after Bypass surgery at age 70  . Other Mother 45    angioplasty  . Allergic rhinitis Mother   . Angioedema Neg Hx   . Asthma Neg Hx   . Eczema Neg Hx   . Immunodeficiency Neg Hx   . Urticaria Neg Hx    Social History: Social History  . Marital Status:     Spouse Name: N/A  . Number of Children: N/A  . Years of Education: N/A   Social History Main Topics  . Smoking status: Former Smoker    Types: Pipe    Start date: 02/09/1994    Quit date: 01/18/1997  . Smokeless tobacco: Never Used     Comment: Occ Pipe Smoker during that time.  . Alcohol Use: 0.0 oz/week    0 Standard drinks or equivalent per week  . Drug Use: No  . 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  Richard Pollard has a current medication list which includes the following prescription(s): aspirin, furosemide, ibuprofen, losartan, glucosamine chond complex/msm.   Drug Allergies: No Known Allergies  Environmental History: Richard Pollard lives in an older townhouse for with carpet floors, with central heat and air; stuffed mattress, non-feather pillow/comforter without humidifier, pets and smokers.   Review of Systems  Constitutional: Negative for fever, weight loss and malaise/fatigue.       Childhood varicella disease.  HENT: Positive for congestion. Negative for ear pain, hearing loss, nosebleeds and sore throat.   Eyes: Negative for discharge and redness.       Corrective eyeglass lenses.  Respiratory:  Negative for shortness of breath.        Denies history of bronchitis and pneumonia.  Gastrointestinal: Negative for heartburn, nausea, vomiting, abdominal pain, diarrhea and constipation.  Genitourinary: Negative.   Musculoskeletal: Negative for myalgias and joint pain.  Skin: Negative.  Negative for itching and rash.  Neurological: Negative.  Negative for dizziness, seizures, weakness and headaches.  Endo/Heme/Allergies: Positive for environmental allergies.       Denies sensitivity to aspirin, NSAIDs, stinging insects, foods, latex, jewelry and cosmetics.  Immunological: No chronic or recurring infections. Objective:   Filed Vitals:   04/10/15 1418  BP: 114/78  Pulse: 90  Temp: 98 F (36.7 C)  Resp: 18   SpO2 Readings from Last 1 Encounters:  04/10/15 95%   Physical Exam  Constitutional: He is well-developed, well-nourished, and in no distress.  HENT:  Head: Atraumatic.  Right Ear: Tympanic membrane and ear canal normal.  Left Ear: Tympanic membrane and ear canal normal.  Nose: Mucosal edema present. No rhinorrhea. No epistaxis.  Mouth/Throat: Oropharynx is clear and moist and mucous membranes are normal. No oropharyngeal exudate, posterior oropharyngeal edema or posterior oropharyngeal erythema.  Eyes: Conjunctivae are normal.  Neck: Neck supple.  Cardiovascular: Normal rate, S1 normal and S2 normal.   No murmur heard. Pulmonary/Chest: Effort normal and breath sounds normal. He has no wheezes. He has no rhonchi. He has no rales.  Abdominal: Soft. Bowel sounds are normal.  Lymphadenopathy:    He has no cervical adenopathy.  Neurological: He is alert.  Skin: Skin is warm and intact. No rash noted. No cyanosis. Nails show no clubbing.   Diagnostics: Spirometry:  FVC 4.87--96%, FEV1 3.84--101%.    Skin testing: Minimal reactivity to selected mold species and dust mite mix--see flowsheet.    Nishaan Stanke M. Ishmael Holter, MD   cc: Stephens Shire, MD

## 2015-04-10 NOTE — Patient Instructions (Signed)
Take Home Sheet  1. Avoidance: Mite and Mold   2. Nasal Spray: Dymista one spray(s) each nostril twice daily for stuffy nose or drainage.     3. Nasal Saline wash prior to Dymista and as needed.   4. Follow up Visit:  6 weeks or sooner if needed.   Websites that have reliable Patient information: 1. American Academy of Asthma, Allergy, & Immunology: www.aaaai.org 2. Food Allergy Network: www.foodallergy.org 3. Mothers of Asthmatics: www.aanma.org 4. [place]: DiningCalendar.de 5. American College of Allergy, Asthma, & Immunology: https://robertson.info/ or www.acaai.org

## 2015-05-27 DIAGNOSIS — Z889 Allergy status to unspecified drugs, medicaments and biological substances status: Secondary | ICD-10-CM | POA: Insufficient documentation

## 2015-05-27 DIAGNOSIS — R5383 Other fatigue: Secondary | ICD-10-CM | POA: Insufficient documentation

## 2015-05-27 DIAGNOSIS — M7050 Other bursitis of knee, unspecified knee: Secondary | ICD-10-CM | POA: Insufficient documentation

## 2015-05-27 DIAGNOSIS — G479 Sleep disorder, unspecified: Secondary | ICD-10-CM | POA: Insufficient documentation

## 2016-02-13 MED FILL — TERBINAFINE HCL 250 MG TAB: 250 | 30 days supply | Qty: 30 | Fill #0

## 2016-12-30 MED FILL — MONTELUKAST SOD 10 MG TAB: 10 | 30 days supply | Qty: 30 | Fill #0

## 2017-02-01 MED FILL — MONTELUKAST SOD 10 MG TAB: 10 | 30 days supply | Qty: 30 | Fill #0

## 2017-02-07 ENCOUNTER — Encounter: Payer: Self-pay | Admitting: Neurology

## 2017-02-09 ENCOUNTER — Ambulatory Visit (INDEPENDENT_AMBULATORY_CARE_PROVIDER_SITE_OTHER): Payer: BLUE CROSS/BLUE SHIELD | Admitting: Neurology

## 2017-02-09 ENCOUNTER — Encounter: Payer: Self-pay | Admitting: Neurology

## 2017-02-09 VITALS — BP 111/71 | HR 91 | Ht 71.0 in | Wt 288.5 lb

## 2017-02-09 DIAGNOSIS — R0683 Snoring: Secondary | ICD-10-CM | POA: Insufficient documentation

## 2017-02-09 DIAGNOSIS — G4733 Obstructive sleep apnea (adult) (pediatric): Secondary | ICD-10-CM | POA: Diagnosis not present

## 2017-02-09 DIAGNOSIS — Z9114 Patient's other noncompliance with medication regimen: Secondary | ICD-10-CM | POA: Diagnosis not present

## 2017-02-09 DIAGNOSIS — G4700 Insomnia, unspecified: Secondary | ICD-10-CM | POA: Insufficient documentation

## 2017-02-09 DIAGNOSIS — I10 Essential (primary) hypertension: Secondary | ICD-10-CM | POA: Diagnosis not present

## 2017-02-09 DIAGNOSIS — J0120 Acute ethmoidal sinusitis, unspecified: Secondary | ICD-10-CM | POA: Insufficient documentation

## 2017-02-09 DIAGNOSIS — G4731 Primary central sleep apnea: Secondary | ICD-10-CM | POA: Insufficient documentation

## 2017-02-09 DIAGNOSIS — G4730 Sleep apnea, unspecified: Secondary | ICD-10-CM | POA: Diagnosis not present

## 2017-02-09 DIAGNOSIS — Z6841 Body Mass Index (BMI) 40.0 and over, adult: Secondary | ICD-10-CM

## 2017-02-09 NOTE — Progress Notes (Signed)
SLEEP MEDICINE CLINIC   Provider:  Larey Pollard, M D  Primary Care Physician:  Richard Shire, MD   Referring Provider: Stephens Shire, MD  Richard Memos, PA.  Chief Complaint  Patient presents with  . New Patient (Initial Visit)    Patient had a sleep study in 2012, he does not currently use a CPAP machine.   Patient here alone, had a sleep study at [facility] in 04-23-2011, this very abnormal sleep architecture, the only sleep study that is available to me here today is his CPAP titration, not the study he was diagnosed with OSA.  Both at same lab.  He gave up CPAP over 2-1/2 years ago, after struggling with air leaks using a full facemask.    HPI:  Richard Pollard is a 67 y.o. male , seen here  in a referral from Richard Pollard  at [facility], later [place], now [facility] in [place].   02-09-2017 ,CD Consult ;  Richard Pollard reports that he was diagnosed with obstructive sleep apnea after study at [facility], was asked to return for CPAP titration but I do not have a physician's interpretation of either study available.  He was issued a CPAP at the time and use it for about 2 years but was increasing struggles.  He has full facial hair, and is a mouth breather and the only option of a full facemask did not allow a good air seal. His CPAP data report again this is not the physician's interpretation, documented an AHI of 12.7 during the titration, there were a low index of 2.1, the longest hypopnea was 28.7 seconds the longest apnea was 21 seconds, his apnea and hypopnea index did not state what the baseline was.  He did have a lot of REM sleep rebounding under CPAP apparently this is why I called is an abnormal sleep architecture. He had a very good sleep efficiency under CPAP.   His AHI at 5 cm water baseline pressure for a total sleep time of only 18.5  minutes was 100.5 /hr. ! He recalled dreaming a lot on CPAP .   The patient reports that he has  lifelong struggle with sinus problems, and that these were aggravated as he used CPAP.  The DME on [address] was not helpful, he was not instructed on how to maintain the machine, cleaning, how to adjust the humidifier. He felt congested, had sinus inflammation, was evaluated by allergy specialist and ENT physicians and could not find a lasting improvement.  Even today he has a slightly congested nasal passage.  [name] feels that it is time to revisit the apnea and possible therapies, as his wife recently was diagnosed and is doing excellent with CPAP.  He has also noticed that the machines are quite advanced in comparison.  Sleep habits are as follows: He goes to bed between 10 and 11, he shares a bedroom with his wife, the bedroom is cool, quiet and dark.  He sleeps on 2 pillows, and on his sides.  His wife has noticed that he still snores and has apnea.  He does not have nocturia.  Once asleep he can stay asleep.  He rises in the morning at 4 AM, and feels often awake before the alarm rings.  Some nights he will have a very fragmented sleep other nights he will sleep soundly. He falls asleep rather promptly.  He reports cycles of 203 night duration when he has very  vivid and lucid dreams and seems to have a lot of dreaming alternating with sometimes weeks and months of not having these experiences.  He does not feel necessarily restored and refreshed after sleep.  He does not report a dry mouth,  Sleep medical history and family sleep history:   Sister has good sleep, parents were healthy and slim , the patient's father had a quadruple bypass and died 52 years later in his early 64s. tonsillectomy at age 28. No sinus, neck surgery, TBI or autoimmune disease.  Obesity since age 19 .  The patient has always had poor pulmonary function capacity. Carries a diagnosis of hypertension, morbid obesity, malaise and fatigue, has history of respiratory and renal rhinitis due to allergies, deviated septum,  sinusitis, prostate cancer, insomnia in the past as well as hypersomnia, untreated obstructive sleep apnea, hypertensive heart disease with congestive heart failure, status post corneal transplant, Fuch's.    Social history:  Works in [employer] account planner.  married, wife uses CPAP.  No children. Day shift starts at 7 AM until 3 Pm. Smoker, quit 1999. Etoh : social wine drinker, less than one a week.  Caffeine-he considers himself a diet soda junkie, drinking about a liter a day, diet Pepsi. No exercise.    Review of Systems: Out of a complete 14 system review, the patient complains of only the following symptoms, and all other reviewed systems are negative. See above,  Epworth score  12 , Fatigue severity score 32 , depression score n/a    Social History   Socioeconomic History  . Marital status: Divorced    Spouse name: Not on file  . Number of children: Not on file  . Years of education: Not on file  . Highest education level: Not on file  Social Needs  . Financial resource strain: Not on file  . Food insecurity - worry: Not on file  . Food insecurity - inability: Not on file  . Transportation needs - medical: Not on file  . Transportation needs - non-medical: Not on file  Occupational History  . Not on file  Tobacco Use  . Smoking status: Former Smoker    Types: Pipe    Start date: 02/09/1994    Last attempt to quit: 01/18/1997    Years since quitting: 20.0  . Smokeless tobacco: Never Used  . Tobacco comment: Occ Pipe Smoker during that time.  Substance and Sexual Activity  . Alcohol use: Yes    Alcohol/week: 0.0 oz  . Drug use: No  . Sexual activity: Not on file  Other Topics Concern  . Not on file  Social History Narrative  . Not on file    Family History  Problem Relation Age of Onset  . Heart attack Father 42       after Bypass surgery at age 73  . Other Mother 83       angioplasty  . Allergic rhinitis Mother   . Coronary artery disease  Unknown   . Angioedema Neg Hx   . Asthma Neg Hx   . Eczema Neg Hx   . Immunodeficiency Neg Hx   . Urticaria Neg Hx     Past Medical History:  Diagnosis Date  . Anxiety /Depression-mild   . Chest tightness   . Constipation   . Dyspnea on exertion   . Erectile dysfunction   . Fatigue   . Hypertension   . Multiple allergies   . Obesity   . OSA (obstructive sleep apnea)   .  PSA elevation 07/2010    Past Surgical History:  Procedure Laterality Date  . APPENDECTOMY  1995  . EYE SURGERY  2010  . TONSILLECTOMY     childhood    Current Outpatient Medications  Medication Sig Dispense Refill  . buPROPion (WELLBUTRIN SR) 150 MG 12 hr tablet Take by mouth.    . furosemide (LASIX) 40 MG tablet Take 40 mg by mouth 2 (two) times daily. Take one tablet by mouth twice daily.    Marland Kitchen losartan (COZAAR) 50 MG tablet TAKE 1 TABLET BY MOUTH DAILY 90 tablet 3  . montelukast (SINGULAIR) 10 MG tablet Take 10 mg by mouth at bedtime.    Marland Kitchen aspirin 325 MG tablet Take 325 mg by mouth 3 (three) times a week.     . Azelastine-Fluticasone 137-50 MCG/ACT SUSP One Spray into the nostril twice daily for stuffy nose or drainage. 23 g 5  . fexofenadine (ALLEGRA) 180 MG tablet Take 180 mg by mouth.    . fish oil-omega-3 fatty acids 1000 MG capsule Take 3 g by mouth daily. Reported on 04/10/2015    . fluticasone (FLONASE) 50 MCG/ACT nasal spray Place 2 sprays into the nose daily. Reported on 04/10/2015    . Glucosamine-Chondroitin 500-400 MG CAPS Take by mouth.    Marland Kitchen ibuprofen (ADVIL,MOTRIN) 200 MG tablet Take 800 mg by mouth every 6 (six) hours as needed for pain.    . methocarbamol (ROBAXIN-750) 750 MG tablet Take 1 tablet (750 mg total) by mouth 4 (four) times daily. (Patient not taking: Reported on 04/10/2015) 30 tablet 0  . Misc Natural Products (GLUCOSAMINE CHOND COMPLEX/MSM) TABS Take 1 tablet by mouth daily.      . Multiple Vitamins-Minerals (MULTIVITAMIN WITH MINERALS) tablet Take 1 tablet by mouth daily.  Reported on 04/10/2015    . oxyCODONE-acetaminophen (PERCOCET/ROXICET) 5-325 MG per tablet Take 2 tablets by mouth every 4 (four) hours as needed for pain. (Patient not taking: Reported on 04/10/2015) 20 tablet 0  . sertraline (ZOLOFT) 25 MG tablet Take 25 mg by mouth daily. Reported on 04/10/2015    . Vardenafil HCl (LEVITRA PO) Take by mouth as needed. Reported on 04/10/2015     No current facility-administered medications for this visit.     Allergies as of 02/09/2017  . (No Known Allergies)    Vitals: BP 111/71   Pulse 91   Ht 5\' 11"  (1.803 m)   Wt 288 lb 8 oz (130.9 kg)   BMI 40.24 kg/m  Last Weight:  Wt Readings from Last 1 Encounters:  02/09/17 288 lb 8 oz (130.9 kg)   TFT:DDUK mass index is 40.24 kg/m.     Last Height:   Ht Readings from Last 1 Encounters:  02/09/17 5\' 11"  (1.803 m)    Physical exam:  General: The patient is awake, alert and appears not in acute distress. The patient has full facial hair.   Head: Normocephalic, atraumatic. Neck is supple. Mallampati 5 -the uvula is invisible. neck circumference:20. Nasal airflow congested , Retrognathia is not seen, slight overbite . Partial dentures upper.  Cardiovascular:  Regular rate and rhythm , without  murmurs or carotid bruit, and without distended neck veins. Respiratory: Lungs are clear to auscultation. Skin:  Without evidence of edema, or rash Trunk: BMI is 40 plus . The patient's built is portly   Neurologic exam : The patient is awake and alert, oriented to place and time.   Memory subjective described as intact.   Attention span & concentration ability  appears normal.  Speech is fluent with Logorrhea, without dysarthria, dysphonia or aphasia.  Mood and affect are appropriate.  Cranial nerves: Pupils are equal and briskly reactive to light. Funduscopic exam deferred ( Corneal transplant , Fuchs) . Extraocular movements  in vertical and horizontal planes intact and without nystagmus. Visual fields by  finger perimetry are intact. Hearing to finger rub intact.   Facial sensation intact to fine touch.  Facial motor strength is symmetric and tongue and uvula move midline. Shoulder shrug was symmetrical.   Motor exam: Normal tone, muscle bulk and symmetric strength in all extremities. There is a slight increase in tone over the left biceps, buy no cog wheeling.  Sensory:  Fine touch, pinprick and vibration were tested in all extremities. Proprioception tested in the upper extremities was normal. Coordination: Rapid alternating movements in the fingers/hands was normal. Finger-to-nose maneuver  normal without evidence of ataxia, dysmetria or tremor. Gait and station: Patient walks without assistive device and is able unassisted to climb up to the exam table. Strength within normal limits. Stance is stable and normal.  Toe and heel stand were inatct.Tandem gait is unfragmented.Turns with 3 Steps. Romberg testing is negative. Deep tendon reflexes: in the upper and lower extremities are symmetric and intact. Babinski maneuver response is downgoing.   Assessment:  After physical and neurologic examination, review of laboratory studies,  Personal review of imaging studies, reports of other /same  Imaging studies, results of polysomnography and / or neurophysiology testing and pre-existing records as far as provided in visit., my assessment is   1) Mr. Radoncic has several risk factors for obstructive sleep apnea including a body mass index that exceeds 40, high-grade Mallampati without unusually arched palate, macroglossia, and a large neck circumference at 20 inches.  I have no doubt that he still has apnea but I do not know if it is quite as severe as it seems to have been 5 years ago.   My goal for the patient is to have a repeat sleep study to establish the degree of apnea, the kind of apnea and also then choose the best possible treatment.    2)He does have nasal congestion and he may need to use a  decongestant or Nasacort to tolerate the CPAP better.  His first CPAP therapy may have also failed because he was not instructed very well and did not have feedback or a place to go to when difficulties arose.  3) he prefers to sleep on his side which she could not do when he used CPAP with a full facemask.  He used 2 pillows for head support while using CPAP years ago.  He feels sets he has a hard time breathing if his airway is " kinked" .  4) a recent cardiac stress test was normal. He is treated for HTN.   The patient was advised of the nature of the diagnosed disorder , the treatment options and the  risks for general health and wellness arising from not treating the condition.   I spent more than 50 minutes of face to face time with the patient. Greater than 50% of time was spent in counseling and coordination of care. We have discussed the diagnosis and differential and I answered the patient's questions.    Plan:  Treatment plan and additional workup :  The patient is definitely deconditioned, he does not have a great exercise capacity or tolerance for exercise.  He has gained weight progressively throughout his 53s.  These all  other risk factors for the presence of obstructive sleep apnea and we know from 5 years ago that he had severe sleep apnea.  I will ask him to come back for a split-night polysomnography I want to see him sleep for 1 or 2 hours without any treatment to determine the current level of apnea and type of apnea then we will follow with a CPAP titration and if CPAP should not be tolerated be can try changing the modality to BiPAP.  I would like for him to use the nasal spray I do discussed above in advance so that he has a better nasal passage and  patent airflow. He did well on fexofenadine.  Rv after SPLIT -  Please split as soon as he has reached AHI of 20.    Richard Seat, MD 09/14/32, 9:17 PM  Certified in Neurology by ABPN Certified in [place] by  [facility] [address] [place], [place] [address]

## 2017-02-16 ENCOUNTER — Telehealth: Payer: Self-pay | Admitting: Neurology

## 2017-02-16 NOTE — Telephone Encounter (Signed)
We have attempted to call the patient two times to schedule sleep study. Patient has been unavailable at the phone numbers we have on file, and has not returned our calls. At this time, we will send a letter asking patient to please contact the sleep lab.  °

## 2017-02-21 MED FILL — MELOXICAM 15 MG TABLET: 15 | 30 days supply | Qty: 30 | Fill #0

## 2017-03-16 ENCOUNTER — Ambulatory Visit (INDEPENDENT_AMBULATORY_CARE_PROVIDER_SITE_OTHER): Payer: BLUE CROSS/BLUE SHIELD | Admitting: Neurology

## 2017-03-16 DIAGNOSIS — J0120 Acute ethmoidal sinusitis, unspecified: Secondary | ICD-10-CM

## 2017-03-16 DIAGNOSIS — G4700 Insomnia, unspecified: Secondary | ICD-10-CM

## 2017-03-16 DIAGNOSIS — Z9114 Patient's other noncompliance with medication regimen: Secondary | ICD-10-CM

## 2017-03-16 DIAGNOSIS — R0683 Snoring: Secondary | ICD-10-CM

## 2017-03-16 DIAGNOSIS — G4730 Sleep apnea, unspecified: Secondary | ICD-10-CM

## 2017-03-16 DIAGNOSIS — Z6841 Body Mass Index (BMI) 40.0 and over, adult: Secondary | ICD-10-CM

## 2017-03-16 DIAGNOSIS — G4733 Obstructive sleep apnea (adult) (pediatric): Secondary | ICD-10-CM | POA: Diagnosis not present

## 2017-03-16 DIAGNOSIS — I10 Essential (primary) hypertension: Secondary | ICD-10-CM

## 2017-03-16 DIAGNOSIS — Z91199 Patient's noncompliance with other medical treatment and regimen due to unspecified reason: Secondary | ICD-10-CM

## 2017-03-18 ENCOUNTER — Telehealth: Payer: Self-pay | Admitting: *Deleted

## 2017-03-18 ENCOUNTER — Other Ambulatory Visit: Payer: Self-pay | Admitting: Neurology

## 2017-03-18 DIAGNOSIS — G4731 Primary central sleep apnea: Secondary | ICD-10-CM

## 2017-03-18 NOTE — Telephone Encounter (Signed)
Called pt & LVM asking for call back to discuss sleep study results. Left office number & hours in message.

## 2017-03-18 NOTE — Procedures (Signed)
PATIENT'S NAME:  Richard Pollard DOB:      1950-10-12      MR#:    [id number]     DATE OF RECORDING: 03/16/2017 REFERRING M.D.:  Juanita Craver, M.D. Study Performed:   Baseline Polysomnogram HISTORY:  Mr. Richard Pollard. Dejonge reports daily sleepiness, his wife reports he is apnoeic and snores. He was diagnosed with obstructive sleep apnea at [facility] 04-23-2011. He was asked to return for CPAP titration, was issued a CPAP and used it for about 2 years but had increasingly struggled -and over 2 years ago became non- compliant.  He has full facial hair, is a mouth breather, and the only option given to him was a full facemask which did not allow a good air seal. His CPAP data report (again this is not the physician's interpretation), noted an AHI of 12.7 during the titration, the longest hypopnea was 28.7 seconds , the longest apnea was 21 seconds, his apnea and hypopnea index at baseline was nowhere stated.  He did have a lot of REM sleep rebounding with very good sleep efficiency under CPA.  His AHI at 5 cm water baseline pressure was 100.5 /hr. !  The patient endorsed the Epworth Sleepiness Scale at 12 points.  FSS at 32 points. The patient's weight 227 pounds with a height of 71 (inches), resulting in a BMI of 31.8 kg/m2. The patient's neck circumference measured 20 inches.  CURRENT MEDICATIONS: Wellbutrin, Lasix, Cozaar, Singulair, Aspirin, Allegra, Flonase, Motrin, Robaxin, Percocet, Zoloft, Levitra   PROCEDURE:  This is a multichannel digital polysomnogram utilizing the Somnostar 11.2 system.  Electrodes and sensors were applied and monitored per AASM Specifications.   EEG, EOG, Chin and Limb EMG, were sampled at 200 Hz.  ECG, Snore and Nasal Pressure, Thermal Airflow, Respiratory Effort, CPAP Flow and Pressure, Oximetry was sampled at 50 Hz. Digital video and audio were recorded.      BASELINE STUDY Lights Out was at 20:55 and Lights On at 04:53.  Total recording time (TRT) was 478.5 minutes, with a total  sleep time (TST) of 293.5 minutes.   The patient's sleep latency was 47.5 minutes.  REM latency was 47 minutes.  The sleep efficiency was poor at 61.3 %.     SLEEP ARCHITECTURE: WASO (Wake after sleep onset) was 174 minutes.  There were 60.5 minutes in Stage N1, 133 minutes Stage N2, 10 minutes Stage N3 and 90 minutes in Stage REM.  The percentage of Stage N1 was 20.6%, Stage N2 was 45.3%, Stage N3 was 3.4% and Stage R (REM sleep) was 30.7%.   The arousals were noted as: 33 were spontaneous, 17 were associated with PLMs, and 44 were associated with respiratory events. RESPIRATORY ANALYSIS:  There were a total of 72 respiratory events:  1 obstructive apnea, 35 central apneas and 3 mixed apneas with 33 hypopneas. The patient also had 0 respiratory event related arousals (RERAs).   The total APNEA/HYPOPNEA INDEX (AHI) was 14.7/hour and the total RESPIRATORY DISTURBANCE INDEX was 14.7 /hour.  27 events occurred in REM sleep and 50 events in NREM. The REM AHI was 18.0 /hour, versus a non-REM AHI of 13.3/hr. The patient spent 47.5 minutes of total sleep time in the supine position and 246 minutes in non-supine. The supine AHI was 55.6/hr. versus a non-supine AHI of 6.8.  OXYGEN SATURATION & C02:  The Wake baseline 02 saturation was 96%, with the lowest being 82%. Time spent below 89% saturation equaled 48 minutes. CO 2 recording failed.  PERIODIC LIMB MOVEMENTS:  The patient had a total of 52 Periodic Limb Movements.  The Periodic Limb Movement (PLM) index was 10.6 and the PLM Arousal index was 3.5/hour. Audio and video analysis did not show any abnormal or unusual movements, behaviors, phonations or vocalizations.   The patient had no nocturia. Snoring was noted. EKG was in keeping with normal sinus rhythm (NSR) with some PACs and PVCs.  Post-study, the patient indicated that sleep was the same as usual.   IMPRESSION:  1. Complex, mostly Central Sleep Apnea (CSA) with an unusual distribution - high AHI  in REM (18.0/hr.) and in supine position( 55.6/hr.), which characterizes usually OSA.  2. 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 Sleep fragmentation, poor sleep efficiency.  RECOMMENDATIONS:  1. Advise full-night, attended, PAP titration study to optimize therapy.  Central apnea may require BiPAP modality. Note that patients with congestive heart failure (CHF), significant lung disease such as chronic obstructive pulmonary disease (COPD), patients expected to have obesity hypoventilation syndrome, patients who do not snore (either naturally or as a result of palate surgery), and patients who have central sleep apnea syndromes are not currently candidates for APAP titration or treatment.   2. There were frequent periodic limb movements of sleep (PLMS) with associated sleep disruption.  Consider treating the PLMS if these do not respond to PAP treatment. Correlate clinically for a history consistent with regarding restless legs syndrome (RLS). Reduce Caffeine !    3. Avoid sedative-hypnotics and alcohol which may worsen sleep apnea (as applicable). Advise to lose weight by diet and exercise if not contraindicated (BMI 32) 4. Consider dedicated sleep psychology referral if insomnia is of clinical concern.   5. A follow up appointment will be scheduled in the Sleep Clinic at [facility]. The referring provider will be notified of the results.     I certify that I have reviewed the entire raw data recording prior to the issuance of this report in accordance with the Standards of Accreditation of the American Academy of Sleep Medicine (AASM)  Larey Seat, MD      03-18-2017  Diplomat, American Board of Psychiatry and Neurology  Diplomat, American Board of Sleep Medicine Medical Director of [facility] [employer]

## 2017-03-18 NOTE — Telephone Encounter (Signed)
-----   Message from Larey Seat, MD sent at 03/18/2017 12:24 PM EST ----- Please fax to Dr. Barbette Hair office:  1. Complex, mostly Central Sleep Apnea (CSA) with an unusual distribution - high AHI in REM (18.0/hr.) and in supine position( 55.6/hr.), which characterizes usually OSA.  2. 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 Sleep fragmentation, poor sleep efficiency.  RECOMMENDATIONS:  1. Advise full-night, attended, PAP titration study to optimize therapy.  Central apnea may require BiPAP modality. Note that patients with congestive heart failure (CHF), significant lung disease such as chronic obstructive pulmonary disease (COPD), patients expected to have obesity hypoventilation syndrome, patients who do not snore (either naturally or as a result of palate surgery), and patients who have central sleep apnea syndromes are not currently candidates for APAP titration or treatment.   2. There were frequent periodic limb movements of sleep (PLMS) with associated sleep disruption.  Consider treating the PLMS if these do not respond to PAP treatment. Correlate clinically for a history consistent with regarding restless legs syndrome (RLS). Reduce Caffeine !

## 2017-03-22 NOTE — Telephone Encounter (Signed)
Called pt & LVM asking for call back to discuss sleep study results. Left office number in message.

## 2017-03-22 NOTE — Telephone Encounter (Signed)
Pt returned RNs call. Spoke with the patient regarding sleep study results. Dr. Brett Fairy found that patient has compex, mostly central sleep apnea in which he had AHI of 18/hr and with worsening while supine. Discussed that Dr. Brett Fairy advises a full-night PAP titration study to determine whether CPAP or BiPAP will be most helpful in treating the sleep apnea. Also discussed that pt had frequent PLMs which may improve with PAP therapy however can consider treatment in future if necessary. Patient recalled that with CPAP in the past, he did have improvement in movements. He also stated that he was unable to tolerate the nasal mask in the past. RN advised to reduce caffeine intake, maintain healthy weight with diet & exercise. Patient reported that he does have trouble sometimes with sleeping and other times he can go right to sleep. He is aware that the office will be calling soon to schedule the study. He has requested to be called after 3 PM if possible as his job does not allow him to have his phone with him during work hours.

## 2017-03-24 MED FILL — MELOXICAM 15 MG TABLET: 15 | 30 days supply | Qty: 30 | Fill #1

## 2017-04-22 ENCOUNTER — Ambulatory Visit (INDEPENDENT_AMBULATORY_CARE_PROVIDER_SITE_OTHER): Payer: BLUE CROSS/BLUE SHIELD | Admitting: Neurology

## 2017-04-22 DIAGNOSIS — G4731 Primary central sleep apnea: Secondary | ICD-10-CM | POA: Diagnosis not present

## 2017-04-25 ENCOUNTER — Telehealth: Payer: Self-pay

## 2017-04-25 NOTE — Telephone Encounter (Signed)
-----   Message from Larey Seat, MD sent at 04/25/2017  2:30 PM EDT ----- DIAGNOSIS Complex Sleep Apnea responded well to BiPAP at 16/12 cm water,  with heated humidity. The patient was fitted with an AirTouch P  10 in large size. This Complex SA did not respond to CPAP.   PLANS/RECOMMENDATIONS: BiPAP initiation and follow up in 60-90  days.  Maintain lean body weight. The patient should avoid evening  sedatives, hypnotics, and alcohol beverage consumption until the  above problem is corrected. Avoiding sleeping in the supine  position (on one's back). Improvement of nasal patency if  Indicated.  Cc Dr Tollie Pizza, please fax.

## 2017-04-25 NOTE — Telephone Encounter (Signed)
I called pt. I advised pt that Dr. Brett Fairy reviewed their sleep study results and found that pt did well with his bipap. Dr. Brett Fairy recommends that pt start a bipap at home. I reviewed PAP compliance expectations with the pt. Pt is agreeable to starting a BiPAP. I advised pt that an order will be sent to a DME, [facility], and [facility] will call the pt within about one week after they file with the pt's insurance. [facility] will show the pt how to use the machine, fit for masks, and troubleshoot the BiPAP if needed. A follow up appt was made for insurance purposes with Hoyle Sauer, NP on 08/11/17 at 3:45pm. Pt verbalized understanding to arrive 15 minutes early and bring their BiPAP. A letter with all of this information in it will be mailed to the pt as a reminder. I verified with the pt that the address we have on file is correct. I advised pt to avoid sedative-hypnotics which may worsen sleep apnea, alcohol and tobacco.   I advised pt to avoid supine sleep. Pt verbalized understanding of results. Pt had no questions at this time but was encouraged to call back if questions arise.

## 2017-04-25 NOTE — Procedures (Signed)
PATIENT'S NAME:  Richard Pollard, Richard Pollard DOB:      [birth year]      MR#:    [id number]     DATE OF RECORDING: 04/22/2017, Oneita Jolly, PSGT REFERRING M.D.:  Juanita Craver MD. @ [facility] Study Performed:   Titration to PAP HISTORY: This titration follows a diagnostic PSG from 03-16-2017 in which the AHI was 18.0/h,  mostly central apneas. Supine AHI was 55.6/h and total desaturation time was 48 minutes.  Dx list of : Anxiety, Depression, Chest tightness, Constipation, Dyspnea on exertion, Erectile dysfunction, Fatigue, Hypertension, Multiple allergies, Obesity, OSA The patient endorsed the Epworth Sleepiness Scale at 12 points.  The patient's weight 289 pounds with a height of 71 (inches), resulting in a BMI of 40.4 kg/m2.The patient's neck circumference measured 20 inches.  CURRENT MEDICATIONS: Wellbutrin, Lasix, Cozaar, Singulair, Aspirin, Allegra, Flonase, Motrin, Robaxin, Percocet, Zoloft, Levitra   PROCEDURE:  This is a multichannel digital polysomnogram utilizing the SomnoStar 11.2 system.  Electrodes and sensors were applied and monitored per AASM Specifications.   EEG, EOG, Chin and Limb EMG, were sampled at 200 Hz.  ECG, Snore and Nasal Pressure, Thermal Airflow, Respiratory Effort, CPAP Flow and Pressure, Oximetry was sampled at 50 Hz. Digital video and audio were recorded.      CPAP was initiated at 6 cmH20 with heated humidity per AASM split night standards and pressure was advanced to 14cmH20 because of hypopneas, apneas and desaturations. The residual AHI remained too high on CPAP (6.5/h) and the technician changed to BiPAP at 15/11 and advanced up to 16/12 cm water pressure.  At a BiPAP pressure of 16/12 cmH20, there was a reduction of the AHI to 0.0/h with improvement of the above symptoms of obstructive sleep apnea.    Lights Out was at 22:36 and Lights On at 04:57. Total recording time (TRT) was 382 minutes, with a total sleep time (TST) of 365 minutes. The patient's sleep latency was 0.5  minutes with 0.5 minutes of wake time after sleep onset. REM latency was 67 minutes.  The sleep efficiency was 95.5 %.    SLEEP ARCHITECTURE: WASO (Wake after sleep onset) was 16 minutes.  There were 41.5 minutes in Stage N1, 182 minutes Stage N2, 1.5 minutes Stage N3 and 140 minutes in Stage REM.  The percentage of Stage N1 was 11.4%, Stage N2 was 49.9%, Stage N3 was .4% and Stage R (REM sleep) was 38.4%. The sleep architecture was notable for REM sleep rebound.  RESPIRATORY ANALYSIS:  There was a total of 72 respiratory events: 9 obstructive apneas, 3 central apneas and 5 mixed apneas with 55 hypopneas. The patient also had 0 respiratory event related arousals (RERAs). The total APNEA/HYPOPNEA INDEX  (AHI) was 11.8 /hour and the total RESPIRATORY DISTURBANCE INDEX was 11.8 .hour  5 events occurred in REM sleep and 67 events in NREM. The REM AHI was 2.1 /hour versus a non-REM AHI of 17.9 /hour.  The patient spent 198 minutes of total sleep time in the supine position and 167 minutes in non-supine. The supine AHI was 20.7, versus a non-supine AHI of 1.4.  OXYGEN SATURATION & C02:  The baseline 02 saturation was 93%, with the lowest being 82%. Time spent below 89% saturation equaled 10 minutes.  PERIODIC LIMB MOVEMENTS:    The patient had a total of 261 Periodic Limb Movements. The Periodic Limb Movement Arousal index was 0.2 /hour. The arousals were noted as: 10 were spontaneous, 1 were associated with PLMs, and 41 were still associated  with respiratory events. Audio and video analysis did not show any abnormal or unusual movements, behaviors, phonations or vocalizations.  Mild Snoring was noted. EKG was in keeping with normal sinus rhythm (NSR).  Post-study, the patient indicated that sleep was the same as usual. The patient was fitted with an AirTouch P 10 in large size.  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 The patient was fitted with an  AirTouch P 10 in large size. Complex SA did not respond to CPAP.   PLANS/RECOMMENDATIONS: BiPAP initiation and follow up in 60-90 days.  Maintain lean body weight. The patient should avoid evening sedatives, hypnotics, and alcohol beverage consumption until the above problem is corrected. Avoiding sleeping in the supine position (on one's back). Improvement of nasal patency if indicated.  A follow up appointment will be scheduled in the Sleep Clinic at [facility].   Please call [phone] with any questions.      I certify that I have reviewed the entire raw data recording prior to the issuance of this report in accordance with the Standards of Accreditation of the American Academy of Sleep Medicine (AASM)      Larey Seat, M.D.  04-25-2017  Diplomat, American Board of Psychiatry and Neurology  Diplomat, [place] of Sleep Medicine Medical Director of [facility] at [facility]

## 2017-04-25 NOTE — Addendum Note (Signed)
Addended by: Larey Seat on: 04/25/2017 02:30 PM   Modules accepted: Orders

## 2017-06-07 ENCOUNTER — Encounter: Payer: Self-pay | Admitting: Neurology

## 2017-07-12 MED FILL — diazePAM 5 MG TABS: 5 | 1 days supply | Qty: 4 | Fill #0

## 2017-07-14 MED FILL — AMOXICILLIN 500 MG CAPSULE: 500 | 10 days supply | Qty: 30 | Fill #0

## 2017-08-10 NOTE — Progress Notes (Deleted)
[facility]  PATIENT: Richard Pollard DOB: 1950-06-18   REASON FOR VISIT: *** HISTORY FROM:    HISTORY OF PRESENT ILLNESS: 02-09-2017 ,CD Consult ;  Richard Pollard. Reading reports that he was diagnosed with obstructive sleep apnea after study at [facility], was asked to return for CPAP titration but I do not have a physician's interpretation of either study available.  He was issued a CPAP at the time and use it for about 2 years but was increasing struggles.  He has full facial hair, and is a mouth breather and the only option of a full facemask did not allow a good air seal. His CPAP data report again this is not the physician's interpretation, documented an AHI of 12.7 during the titration, there were a low index of 2.1, the longest hypopnea was 28.7 seconds the longest apnea was 21 seconds, his apnea and hypopnea index did not state what the baseline was.  He did have a lot of REM sleep rebounding under CPAP apparently this is why I called is an abnormal sleep architecture. He had a very good sleep efficiency under CPAP.   His AHI at 5 cm water baseline pressure for a total sleep time of only 18.5  minutes was 100.5 /hr. ! He recalled dreaming a lot on CPAP .   The patient reports that he has lifelong struggle with sinus problems, and that these were aggravated as he used CPAP.  The DME on [address] was not helpful, he was not instructed on how to maintain the machine, cleaning, how to adjust the humidifier. He felt congested, had sinus inflammation, was evaluated by allergy specialist and ENT physicians and could not find a lasting improvement.  Even today he has a slightly congested nasal passage.  Richard Pollard feels that it is time to revisit the apnea and possible therapies, as his wife recently was diagnosed and is doing excellent with CPAP.  He has also noticed that the machines are quite advanced in comparison.  Sleep habits are as follows: He goes to bed between 10  and 11, he shares a bedroom with his wife, the bedroom is cool, quiet and dark.  He sleeps on 2 pillows, and on his sides.  His wife has noticed that he still snores and has apnea.  He does not have nocturia.  Once asleep he can stay asleep.  He rises in the morning at 4 AM, and feels often awake before the alarm rings.  Some nights he will have a very fragmented sleep other nights he will sleep soundly. He falls asleep rather promptly.  He reports cycles of 203 night duration when he has very vivid and lucid dreams and seems to have a lot of dreaming alternating with sometimes weeks and months of not having these experiences.  He does not feel necessarily restored and refreshed after sleep.  He does not report a dry mouth,    REVIEW OF SYSTEMS: Full 14 system review of systems performed and notable only for those listed, all others are neg:  Constitutional: neg  Cardiovascular: neg Ear/Nose/Throat: neg  Skin: neg Eyes: neg Respiratory: neg Gastroitestinal: neg  Hematology/Lymphatic: neg  Endocrine: neg Musculoskeletal:neg Allergy/Immunology: neg Neurological: neg Psychiatric: neg Sleep : neg   ALLERGIES: No Known Allergies  HOME MEDICATIONS: Outpatient Medications Prior to Visit  Medication Sig Dispense Refill  . aspirin 325 MG tablet Take 325 mg by mouth 3 (three) times a week.     . Azelastine-Fluticasone 137-50  MCG/ACT SUSP One Spray into the nostril twice daily for stuffy nose or drainage. 23 g 5  . buPROPion (WELLBUTRIN SR) 150 MG 12 hr tablet Take by mouth.    . fexofenadine (ALLEGRA) 180 MG tablet Take 180 mg by mouth.    . fish oil-omega-3 fatty acids 1000 MG capsule Take 3 g by mouth daily. Reported on 04/10/2015    . fluticasone (FLONASE) 50 MCG/ACT nasal spray Place 2 sprays into the nose daily. Reported on 04/10/2015    . furosemide (LASIX) 40 MG tablet Take 40 mg by mouth 2 (two) times daily. Take one tablet by mouth twice daily.    . Glucosamine-Chondroitin 500-400 MG  CAPS Take by mouth.    Marland Kitchen ibuprofen (ADVIL,MOTRIN) 200 MG tablet Take 800 mg by mouth every 6 (six) hours as needed for pain.    Marland Kitchen losartan (COZAAR) 50 MG tablet TAKE 1 TABLET BY MOUTH DAILY 90 tablet 3  . methocarbamol (ROBAXIN-750) 750 MG tablet Take 1 tablet (750 mg total) by mouth 4 (four) times daily. (Patient not taking: Reported on 04/10/2015) 30 tablet 0  . Misc Natural Products (GLUCOSAMINE CHOND COMPLEX/MSM) TABS Take 1 tablet by mouth daily.      . montelukast (SINGULAIR) 10 MG tablet Take 10 mg by mouth at bedtime.    . Multiple Vitamins-Minerals (MULTIVITAMIN WITH MINERALS) tablet Take 1 tablet by mouth daily. Reported on 04/10/2015    . oxyCODONE-acetaminophen (PERCOCET/ROXICET) 5-325 MG per tablet Take 2 tablets by mouth every 4 (four) hours as needed for pain. (Patient not taking: Reported on 04/10/2015) 20 tablet 0  . sertraline (ZOLOFT) 25 MG tablet Take 25 mg by mouth daily. Reported on 04/10/2015    . Vardenafil HCl (LEVITRA PO) Take by mouth as needed. Reported on 04/10/2015     No facility-administered medications prior to visit.     PAST MEDICAL HISTORY: Past Medical History:  Diagnosis Date  . Anxiety /Depression-mild   . Chest tightness   . Constipation   . Dyspnea on exertion   . Erectile dysfunction   . Fatigue   . Hypertension   . Multiple allergies   . Obesity   . OSA (obstructive sleep apnea)   . PSA elevation 07/2010    PAST SURGICAL HISTORY: Past Surgical History:  Procedure Laterality Date  . APPENDECTOMY  1995  . EYE SURGERY  2010  . TONSILLECTOMY     childhood    FAMILY HISTORY: Family History  Problem Relation Age of Onset  . Heart attack Father 61       after Bypass surgery at age 65  . Other Mother 60       angioplasty  . Allergic rhinitis Mother   . Coronary artery disease Unknown   . Angioedema Neg Hx   . Asthma Neg Hx   . Eczema Neg Hx   . Immunodeficiency Neg Hx   . Urticaria Neg Hx     SOCIAL HISTORY: Social History    Socioeconomic History  . Marital status: Divorced    Spouse name: Not on file  . Number of children: Not on file  . Years of education: Not on file  . Highest education level: Not on file  Occupational History  . Not on file  Social Needs  . Financial resource strain: Not on file  . Food insecurity:    Worry: Not on file    Inability: Not on file  . Transportation needs:    Medical: Not on file  Non-medical: Not on file  Tobacco Use  . Smoking status: Former Smoker    Types: Pipe    Start date: 02/09/1994    Last attempt to quit: 01/18/1997    Years since quitting: 20.5  . Smokeless tobacco: Never Used  . Tobacco comment: Occ Pipe Smoker during that time.  Substance and Sexual Activity  . Alcohol use: Yes    Alcohol/week: 0.0 oz  . Drug use: No  . Sexual activity: Not on file  Lifestyle  . Physical activity:    Days per week: Not on file    Minutes per session: Not on file  . Stress: Not on file  Relationships  . Social connections:    Talks on phone: Not on file    Gets together: Not on file    Attends religious service: Not on file    Active member of club or organization: Not on file    Attends meetings of clubs or organizations: Not on file    Relationship status: Not on file  . Intimate partner violence:    Fear of current or ex partner: Not on file    Emotionally abused: Not on file    Physically abused: Not on file    Forced sexual activity: Not on file  Other Topics Concern  . Not on file  Social History Narrative  . Not on file     PHYSICAL EXAM  There were no vitals filed for this visit. There is no height or weight on file to calculate BMI.  Generalized: Well developed, in no acute distress  Head: normocephalic and atraumatic,. Oropharynx benign  Neck: Supple, no carotid bruits  Cardiac: Regular rate rhythm, no murmur  Musculoskeletal: No deformity   Neurological examination   Mentation: Alert oriented to time, place, history taking.  Attention span and concentration appropriate. Recent and remote memory intact.  Follows all commands speech and language fluent.   Cranial nerve II-XII: Fundoscopic exam reveals sharp disc margins.Pupils were equal round reactive to light extraocular movements were full, visual field were full on confrontational test. Facial sensation and strength were normal. hearing was intact to finger rubbing bilaterally. Uvula tongue midline. head turning and shoulder shrug were normal and symmetric.Tongue protrusion into cheek strength was normal. Motor: normal bulk and tone, full strength in the BUE, BLE, fine finger movements normal, no pronator drift. No focal weakness Sensory: normal and symmetric to light touch, pinprick, and  Vibration, proprioception  Coordination: finger-nose-finger, heel-to-shin bilaterally, no dysmetria Reflexes: Brachioradialis 2/2, biceps 2/2, triceps 2/2, patellar 2/2, Achilles 2/2, plantar responses were flexor bilaterally. Gait and Station: Rising up from seated position without assistance, normal stance,  moderate stride, good arm swing, smooth turning, able to perform tiptoe, and heel walking without difficulty. Tandem gait is steady  DIAGNOSTIC DATA (LABS, IMAGING, TESTING) - I reviewed patient records, labs, notes, testing and imaging myself where available.  No results found for: WBC, HGB, HCT, MCV, PLT    Component Value Date/Time   CREATININE 1.21 06/06/2013 1050   GFRNONAA 63 (L) 06/06/2013 1050   GFRAA 73 (L) 06/06/2013 1050   No results found for: CHOL, HDL, LDLCALC, LDLDIRECT, TRIG, CHOLHDL No results found for: HGBA1C No results found for: VITAMINB12 No results found for: TSH  ***  ASSESSMENT AND PLAN  67 y.o. year old male  has a past medical history of Anxiety /Depression-mild, Chest tightness, Constipation, Dyspnea on exertion, Erectile dysfunction, Fatigue, Hypertension, Multiple allergies, Obesity, OSA (obstructive sleep apnea), and PSA elevation  (  07/2010). here with ***    Rayburn Ma, [facility], APRN  [facility] [address], [place], [place] [address] [phone]

## 2017-08-11 ENCOUNTER — Telehealth: Payer: Self-pay | Admitting: *Deleted

## 2017-08-11 ENCOUNTER — Ambulatory Visit: Payer: Self-pay | Admitting: Nurse Practitioner

## 2017-08-11 NOTE — Telephone Encounter (Signed)
Patient was no show for follow up with NP today.  

## 2017-08-12 ENCOUNTER — Encounter: Payer: Self-pay | Admitting: Nurse Practitioner

## 2017-11-29 DIAGNOSIS — H33002 Unspecified retinal detachment with retinal break, left eye: Secondary | ICD-10-CM | POA: Insufficient documentation

## 2017-11-29 HISTORY — DX: Unspecified retinal detachment with retinal break, left eye: H33.002

## 2017-12-21 MED FILL — AMOXICILLIN 500 MG CAPSULE: 500 | 10 days supply | Qty: 30 | Fill #0

## 2018-07-12 MED FILL — RHOPRESSA 0.02 % SOLN: 0.02 | 25 days supply | Qty: 5 | Fill #0

## 2018-08-07 MED FILL — RHOPRESSA 0.02 % SOLN: 0.02 | 25 days supply | Qty: 5 | Fill #1

## 2018-09-20 MED FILL — RHOPRESSA 0.02 % SOLN: 0.02 | 25 days supply | Qty: 5 | Fill #0

## 2018-11-09 MED FILL — RHOPRESSA 0.02 % SOLN: 0.02 | 25 days supply | Qty: 5 | Fill #1

## 2019-02-06 MED FILL — PREDNISOLONE AC 1% EYE DROP: 1 | 30 days supply | Qty: 10 | Fill #0

## 2019-03-16 ENCOUNTER — Ambulatory Visit: Payer: BC Managed Care – PPO | Attending: Internal Medicine

## 2019-03-16 DIAGNOSIS — Z23 Encounter for immunization: Secondary | ICD-10-CM | POA: Insufficient documentation

## 2019-03-16 NOTE — Progress Notes (Signed)
   Covid-19 Vaccination Clinic  Name:  TARIG BARROZO    MRN: [id number] DOB: 1951-01-15  03/16/2019  Mr. Lehrman was observed post Covid-19 immunization for 15 minutes without incidence. He was provided with Vaccine Information Sheet and instruction to access the V-Safe system.   Mr. Pinuelas was instructed to call 911 with any severe reactions post vaccine: Marland Kitchen Difficulty breathing  . Swelling of your face and throat  . A fast heartbeat  . A bad rash all over your body  . Dizziness and weakness    Immunizations Administered    Name Date Dose VIS Date Route   Pfizer COVID-19 Vaccine 03/16/2019  3:35 PM 0.3 mL 12/29/2018 Intramuscular   Manufacturer: [place]   Lot: [id number]   [place]: [id number]

## 2019-04-06 ENCOUNTER — Other Ambulatory Visit: Payer: Self-pay | Admitting: Urology

## 2019-04-06 DIAGNOSIS — C61 Malignant neoplasm of prostate: Secondary | ICD-10-CM

## 2019-04-11 ENCOUNTER — Ambulatory Visit: Payer: BC Managed Care – PPO | Attending: Internal Medicine

## 2019-04-11 DIAGNOSIS — Z23 Encounter for immunization: Secondary | ICD-10-CM

## 2019-04-11 NOTE — Progress Notes (Signed)
   Covid-19 Vaccination Clinic  Name:  Richard Pollard    MRN: [id number] DOB: 1950/12/17  04/11/2019  Richard Pollard was observed post Covid-19 immunization for 15 minutes without incident. He was provided with Vaccine Information Sheet and instruction to access the V-Safe system.   Richard Pollard was instructed to call 911 with any severe reactions post vaccine: Marland Kitchen Difficulty breathing  . Swelling of face and throat  . A fast heartbeat  . A bad rash all over body  . Dizziness and weakness   Immunizations Administered    Name Date Dose VIS Date Route   Pfizer COVID-19 Vaccine 04/11/2019  3:38 PM 0.3 mL 12/29/2018 Intramuscular   Manufacturer: [place]   Lot: [id number]   [place]: [id number]

## 2019-05-16 ENCOUNTER — Ambulatory Visit
Admission: RE | Admit: 2019-05-16 | Discharge: 2019-05-16 | Disposition: A | Discharge status: Home / Self Care | Payer: BC Managed Care – PPO | Source: Ambulatory Visit | Attending: Urology | Admitting: Urology

## 2019-05-16 ENCOUNTER — Other Ambulatory Visit: Payer: Self-pay

## 2019-05-16 DIAGNOSIS — C61 Malignant neoplasm of prostate: Secondary | ICD-10-CM

## 2019-05-16 MED ORDER — GADOBENATE DIMEGLUMINE 529 MG/ML IV SOLN
20.0000 mL | Freq: Once | INTRAVENOUS | Status: AC | PRN
  Administered 2019-05-16: 20 mL via INTRAVENOUS

## 2019-05-25 MED FILL — DIAZEPAM 10 MG TABS: 10 | 1 days supply | Qty: 1 | Fill #0

## 2019-05-25 MED FILL — levoFLOXacin 750 MG TABS: 750 | 1 days supply | Qty: 1 | Fill #0

## 2019-06-06 MED FILL — levoFLOXacin 750 MG TABS: 750 | 1 days supply | Qty: 1 | Fill #0

## 2019-06-06 MED FILL — DIAZEPAM 10 MG TABS: 10 | 1 days supply | Qty: 1 | Fill #0

## 2019-06-23 LAB — EXTERNAL GENERIC LAB PROCEDURE: COLOGUARD: NEGATIVE

## 2019-07-03 ENCOUNTER — Encounter: Payer: Self-pay | Admitting: Medical Oncology

## 2019-07-05 ENCOUNTER — Encounter: Payer: Self-pay | Admitting: Medical Oncology

## 2019-07-06 NOTE — Progress Notes (Signed)
I called pt to introduce myself as the Prostate Nurse Navigator and the Coordinator of the Prostate [place].  1. I confirmed with the patient he is aware of his referral to the clinic 6/22, arriving at 12:30 pm.  2. I discussed the format of the clinic and the physicians he will be seeing that day.  3. I discussed where the clinic is located, COVID restrictions and how to contact me.  4. I confirmed his address and informed him I would be mailing a packet of information and forms to be completed. I asked him to bring them with him the day of his appointment.   He voiced understanding of the above. I asked him to call me if he has any questions or concerns regarding his appointments or the forms he needs to complete.

## 2019-07-09 NOTE — Progress Notes (Signed)
GU Location of Tumor / Histology: prostatic adenocarcinoma  If Prostate Cancer, Gleason Score is (4 + 3) and PSA is (9.53). Prostate volume: 99.8 grams  Richard Pollard presented       Biopsies of prostate (if applicable) revealed:   Past/Anticipated interventions by urology, if any: prostate biopsy, active surveillance, referral to [facility].  Past/Anticipated interventions by medical oncology, if any: no  Weight changes, if any: no  Bowel/Bladder complaints, if any: IPSS 10. SHIM 5. Denies dysuria, hematuria, urinary leakage or incontinence. Reports an occasional weak stream. Reports a change in his stool habits but doesn't define.    Nausea/Vomiting, if any: no  Pain issues, if any:  Yes, related to effects of arthritis.   SAFETY ISSUES:  Prior radiation? denies  Pacemaker/ICD? denies  Possible current pregnancy? no, male patient  Is the patient on methotrexate? no  Current Complaints / other details:  69 year old male. Married to [place], a retired Marine scientist. Works in Research officer, trade union. No children.

## 2019-07-10 ENCOUNTER — Inpatient Hospital Stay: Payer: BC Managed Care – PPO | Attending: Oncology | Admitting: Oncology

## 2019-07-10 ENCOUNTER — Encounter: Payer: Self-pay | Admitting: Medical Oncology

## 2019-07-10 ENCOUNTER — Ambulatory Visit
Admission: RE | Admit: 2019-07-10 | Discharge: 2019-07-10 | Disposition: A | Discharge status: Home / Self Care | Payer: BC Managed Care – PPO | Source: Ambulatory Visit | Attending: Radiation Oncology | Admitting: Radiation Oncology

## 2019-07-10 ENCOUNTER — Encounter: Payer: Self-pay | Admitting: Radiation Oncology

## 2019-07-10 ENCOUNTER — Encounter: Payer: Self-pay | Admitting: General Practice

## 2019-07-10 ENCOUNTER — Other Ambulatory Visit: Payer: Self-pay

## 2019-07-10 VITALS — BP 126/82 | HR 85 | Temp 97.8°F | Resp 20 | Ht 72.0 in | Wt 289.6 lb

## 2019-07-10 DIAGNOSIS — E669 Obesity, unspecified: Secondary | ICD-10-CM | POA: Insufficient documentation

## 2019-07-10 DIAGNOSIS — I10 Essential (primary) hypertension: Secondary | ICD-10-CM | POA: Insufficient documentation

## 2019-07-10 DIAGNOSIS — Z79899 Other long term (current) drug therapy: Secondary | ICD-10-CM | POA: Diagnosis not present

## 2019-07-10 DIAGNOSIS — R0602 Shortness of breath: Secondary | ICD-10-CM | POA: Diagnosis not present

## 2019-07-10 DIAGNOSIS — Z7982 Long term (current) use of aspirin: Secondary | ICD-10-CM | POA: Insufficient documentation

## 2019-07-10 DIAGNOSIS — C61 Malignant neoplasm of prostate: Secondary | ICD-10-CM

## 2019-07-10 DIAGNOSIS — Z87891 Personal history of nicotine dependence: Secondary | ICD-10-CM

## 2019-07-10 DIAGNOSIS — R9721 Rising PSA following treatment for malignant neoplasm of prostate: Secondary | ICD-10-CM | POA: Diagnosis not present

## 2019-07-10 DIAGNOSIS — K5900 Constipation, unspecified: Secondary | ICD-10-CM | POA: Insufficient documentation

## 2019-07-10 HISTORY — DX: Malignant neoplasm of prostate: C61

## 2019-07-10 HISTORY — DX: Type 2 diabetes mellitus without complications: E11.9

## 2019-07-10 HISTORY — DX: Depression, unspecified: F32.A

## 2019-07-10 HISTORY — DX: Unspecified osteoarthritis, unspecified site: M19.90

## 2019-07-10 NOTE — Progress Notes (Signed)
Reason for the request:    Prostate cancer  HPI: I was asked by Dr. Alinda Money to evaluate Richard Pollard for the diagnosis of prostate cancer.  He is a 69 year old man with history of elevated PSA dating back to 2009.  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  He was found to have Gleason score 6 prostate cancer in 2015 after PSA continued to rise.  He was on active surveillance and repeat biopsy in 2017 and 2019 showed consistent findings of this low-grade disease.  His PSA continues to rise and was up to 9.53 in April 2021.  Based on these findings he underwent MRI in April 2021 which showed region of low-attenuation in the apical region of prostate cancer indicating a high-grade carcinoma BI-RADS 4.  Repeat biopsy at that time by Dr. Alinda Money showed a Gleason score 4+3 equal 7 and at least 2 cores of the prostate and 2 cores of the targeted biopsy.  He denies any specific lower urinary tract symptoms but does report baseline erectile dysfunction.  He denies any recent hospitalizations or illnesses.  Denies any excessive fatigue or tiredness.  Continues to work full-time.  He does not report any headaches, blurry vision, syncope or seizures. Does not report any fevers, chills or sweats.  Does not report any cough, wheezing or hemoptysis.  Does not report any chest pain, palpitation, orthopnea or leg edema.  Does not report any nausea, vomiting or abdominal pain.  Does not report any constipation or diarrhea.  Does not report any skeletal complaints.    Does not report frequency, urgency or hematuria.  Does not report any skin rashes or lesions. Does not report any heat or cold intolerance.  Does not report any lymphadenopathy or petechiae.  Does not report any anxiety or depression.  Remaining review of systems is negative.    Past Medical History:  Diagnosis Date   Anxiety /Depression-mild    Arthritis    Chest tightness    Constipation    Depression    Diabetes mellitus without complication  (HCC)    Dyspnea on exertion    Erectile dysfunction    Fatigue    Hypertension    Multiple allergies    Obesity    OSA (obstructive sleep apnea)    Prostate cancer ([place])    PSA elevation 07/2010  :  Past Surgical History:  Procedure Laterality Date   APPENDECTOMY  1995   CATARACT EXTRACTION, BILATERAL     EYE SURGERY  2010   detached retina left eye   EYE SURGERY     fuches dystrophy both eyes.   PROSTATE BIOPSY     TONSILLECTOMY     childhood  :   Current Outpatient Medications:    buPROPion (WELLBUTRIN SR) 150 MG 12 hr tablet, Take 1 tablet by mouth 2 (two) times daily., Disp: , Rfl:    calcium carbonate (OSCAL) 1500 (600 Ca) MG TABS tablet, Take by mouth 2 (two) times daily with a meal., Disp: , Rfl:    docusate sodium (COLACE) 100 MG capsule, Take 100 mg by mouth 2 (two) times daily., Disp: , Rfl:    fish oil-omega-3 fatty acids 1000 MG capsule, Take 3 g by mouth daily. Reported on 04/10/2015, Disp: , Rfl:    furosemide (LASIX) 40 MG tablet, Take 40 mg by mouth 2 (two) times daily. Take one tablet by mouth twice daily., Disp: , Rfl:    Glucosamine-Chondroitin 500-400 MG CAPS, Take by mouth., Disp: , Rfl:  glucosamine-chondroitin 500-400 MG tablet, Take 1 tablet by mouth 3 (three) times daily., Disp: , Rfl:    levocetirizine (XYZAL) 5 MG tablet, Take by mouth., Disp: , Rfl:    losartan (COZAAR) 50 MG tablet, TAKE 1 TABLET BY MOUTH DAILY, Disp: 90 tablet, Rfl: 3   meloxicam (MOBIC) 15 MG tablet, Take 15 mg by mouth daily., Disp: , Rfl:    montelukast (SINGULAIR) 10 MG tablet, Take 10 mg by mouth at bedtime., Disp: , Rfl:    prednisoLONE acetate (PRED FORTE) 1 % ophthalmic suspension, Place 1 drop into the left eye 4 times daily., Disp: , Rfl:    terbinafine (LAMISIL) 250 MG tablet, Take 250 mg by mouth daily., Disp: , Rfl:    Vardenafil HCl (LEVITRA PO), Take by mouth as needed. Reported on 04/10/2015 (Patient not taking: Reported on 07/10/2019),  Disp: , Rfl: :  Allergies  Allergen Reactions   Sulfa Antibiotics Rash  :  Family History  Problem Relation Age of Onset   Heart attack Father 2       after Bypass surgery at age 36   Other Mother 33       angioplasty   Allergic rhinitis Mother    Coronary artery disease Other    Angioedema Neg Hx    Asthma Neg Hx    Eczema Neg Hx    Immunodeficiency Neg Hx    Urticaria Neg Hx    Breast cancer Neg Hx    Colon cancer Neg Hx    Pancreatic cancer Neg Hx    Prostate cancer Neg Hx   :  Social History   Socioeconomic History   Marital status: Married    Spouse name: Richard Pollard   Number of children: 0   Years of education: Not on file   Highest education level: Not on file  Occupational History    Comment: technician  Tobacco Use   Smoking status: Former Smoker    Types: Pipe    Start date: 02/09/1994    Quit date: 01/18/1997    Years since quitting: 22.4   Smokeless tobacco: Never Used   Tobacco comment: Occ Pipe Smoker during that time.  Vaping Use   Vaping Use: Never used  Substance and Sexual Activity   Alcohol use: Yes    Alcohol/week: 0.0 standard drinks   Drug use: No   Sexual activity: Not Currently  Other Topics Concern   Not on file  Social History Narrative   Not on file   Social Determinants of Health   Financial Resource Strain:    Difficulty of Paying Living Expenses:   Food Insecurity:    Worried About Charity fundraiser in the Last Year:    Arboriculturist in the Last Year:   Transportation Needs:    Film/video editor (Medical):    Lack of Transportation (Non-Medical):   Physical Activity:    Days of Exercise per Week:    Minutes of Exercise per Session:   Stress:    Feeling of Stress :   Social Connections:    Frequency of Communication with Friends and Family:    Frequency of Social Gatherings with Friends and Family:    Attends Religious Services:    Active Member of Clubs or Organizations:     Attends Archivist Meetings:    Marital Status:   Intimate Partner Violence:    Fear of Current or Ex-Partner:    Emotionally Abused:    Physically Abused:  Sexually Abused:   :  Pertinent items are noted in HPI.  Exam: ECOG 0 General appearance: alert and cooperative appeared without distress. Head: atraumatic without any abnormalities. Eyes: conjunctivae/corneas clear. PERRL.  Sclera anicteric. Throat: lips, mucosa, and tongue normal; without oral thrush or ulcers. Resp: clear to auscultation bilaterally without rhonchi, wheezes or dullness to percussion. Cardio: regular rate and rhythm, S1, S2 normal, no murmur, click, rub or gallop GI: soft, non-tender; bowel sounds normal; no masses,  no organomegaly Skin: Skin color, texture, turgor normal. No rashes or lesions Lymph nodes: Cervical, supraclavicular, and axillary nodes normal. Neurologic: Grossly normal without any motor, sensory or deep tendon reflexes. Musculoskeletal: No joint deformity or effusion.    Assessment and Plan:    69 year old with prostate cancer diagnosed in 2015 after he was found to have a Gleason score of 6 with a PSA of 5.9.  He remained on active surveillance with PSA continues to rise and PSA of 9.5 21 April 2019.  MRI of the prostate showed high-grade lesion is suspicious for high-grade malignancy noted in April 2021 in the apical region.  Biopsy did show Gleason score 4+3 equal 7 indicating high-grade tumor.  His case was discussed today in the prostate cancer multidisciplinary clinic.  This included review by the reviewing pathologist as well as reviewing his radiology imaging studies.  Treatment options were discussed which include primary surgical therapy versus radiation therapy.  Both options were discussed today and the role of additional systemic therapy was reviewed.  The role of androgen deprivation therapy, oral targeted therapy as well as systemic chemotherapy were reviewed  and these options will be deferred unless he has advanced disease in the future.  Short course ADT with radiation would be reasonable at this time but not to exceed 6 months.  This can certainly be withheld based on patient's preference.   He understands also if his diseases not treated, he would be at risk of developing advanced disease and at that point his disease will be incurable.  No additional medical oncology input is needed at this time and I am happy to see him in the future as needed.    A copy of this consult has been forwarded to the requesting physician.

## 2019-07-10 NOTE — Progress Notes (Signed)
Radiation Oncology         [phone] ________________________________  Multidisciplinary Prostate Cancer Clinic  Initial Radiation Oncology Consultation  Name: Richard Pollard MRN: [id number]  Date: 07/10/2019  DOB: 05/26/1950  CC:Stephens Shire, MD  Raynelle Bring, MD   REFERRING PHYSICIAN: Raynelle Bring, MD  DIAGNOSIS: 69 y.o. gentleman with stage T1c adenocarcinoma of the prostate with a Gleason's score of 4+3 and a PSA of 9.53    ICD-10-CM   1. Malignant neoplasm of prostate ([place])  Arnold ILLNESS::Richard Pollard is a 69 y.o. gentleman. He has been followed for an elevated PSA since 12/2007. At that time, he was under the care of Dr. Rosana Hoes. He underwent biopsies in 12/2007 and 05/2008, which showed only HG/PIN. His care was transitioned to Dr. Alinda Money, and his PSA remained elevated but stable. His PSA was noted further elevated at 5.9 in 04/2013 so he underwent prostate MRI on 06/06/2013 which demonstrated a right posterior base lesion (PI-RADS 3) and a left posterior base lesion (PI-RADS 2). He had a Progensa PCA3 test in 06/2013 to evaluate for the likelihood of prostate cancer on biopsy. His score was 29, which is mildly positive and noted to be interpreted with caution. This prompted a repeat biopsy on 08/09/2013, which confirmed very low volume Gleason 3+3 prostate cancer in 2/12 cores.  After thorough discussion regarding treatment options, he opted for active surveillance.   His cancer remained stable despite continued elevation of his PSA. B he had a surveillance biopsy in 02/2015 which showed only HG/PIN, and surveillance biopsy in 07/2017 that confirmed stable, low-volume Gleason 3+3 in 2 of 12 cores with a PSA of 6.6 at that time. His PSA increased to 10.10 in July 2020 and remained elevated at 10.2 on repeat PSA in 01/2019 and most recently at 9.53 at repeat in 04/2019. This prompted a repeat MRI on 05/16/2019 showing an 18 mm PI-RADS 4 lesion with questionable  extracapsular extension at the anterior gland apex.  The prostate volume was estimated at 86.7 mL. The patient proceeded to MRI fusion biopsy on 06/22/2019.  The prostate volume measured 99.8 cc on ultrasound.  Out of 16 core biopsies,7 were positive, including all four ROI MRI lesion cores.  The maximum Gleason score was 4+3, and this was seen in the left apex lateral (with cribriform pattern 4 present).  Additionally, Gleason 3+4 was seen in the four ROI MRI lesion cores, the right apex lateral, and left apex.  The patient reviewed the biopsy results with his urologist and he has kindly been referred today to the multidisciplinary prostate cancer clinic for presentation of pathology and radiology studies in our conference for discussion of potential radiation treatment options and clinical evaluation.  PREVIOUS RADIATION THERAPY: No  PAST MEDICAL HISTORY:  has a past medical history of Anxiety /Depression-mild, Chest tightness, Constipation, Dyspnea on exertion, Erectile dysfunction, Fatigue, Hypertension, Multiple allergies, Obesity, OSA (obstructive sleep apnea), and PSA elevation (07/2010).    PAST SURGICAL HISTORY: Past Surgical History:  Procedure Laterality Date  . APPENDECTOMY  1995  . EYE SURGERY  2010  . TONSILLECTOMY     childhood    FAMILY HISTORY: family history includes Allergic rhinitis in his mother; Coronary artery disease in his unknown relative; Heart attack (age of onset: 63) in his father; Other (age of onset: 9) in his mother.  SOCIAL HISTORY:  reports that he quit smoking about 22 years ago. His smoking use included pipe. He  started smoking about 25 years ago. He has never used smokeless tobacco. He reports current alcohol use. He reports that he does not use drugs.  ALLERGIES: Sulfa antibiotics  MEDICATIONS:  Current Outpatient Medications  Medication Sig Dispense Refill  . aspirin 325 MG tablet Take 325 mg by mouth 3 (three) times a week.     .  Azelastine-Fluticasone 137-50 MCG/ACT SUSP One Spray into the nostril twice daily for stuffy nose or drainage. 23 g 5  . buPROPion (WELLBUTRIN SR) 150 MG 12 hr tablet Take by mouth.    . fexofenadine (ALLEGRA) 180 MG tablet Take 180 mg by mouth.    . fish oil-omega-3 fatty acids 1000 MG capsule Take 3 g by mouth daily. Reported on 04/10/2015    . fluticasone (FLONASE) 50 MCG/ACT nasal spray Place 2 sprays into the nose daily. Reported on 04/10/2015    . furosemide (LASIX) 40 MG tablet Take 40 mg by mouth 2 (two) times daily. Take one tablet by mouth twice daily.    . Glucosamine-Chondroitin 500-400 MG CAPS Take by mouth.    Marland Kitchen ibuprofen (ADVIL,MOTRIN) 200 MG tablet Take 800 mg by mouth every 6 (six) hours as needed for pain.    Marland Kitchen losartan (COZAAR) 50 MG tablet TAKE 1 TABLET BY MOUTH DAILY 90 tablet 3  . methocarbamol (ROBAXIN-750) 750 MG tablet Take 1 tablet (750 mg total) by mouth 4 (four) times daily. (Patient not taking: Reported on 04/10/2015) 30 tablet 0  . Misc Natural Products (GLUCOSAMINE CHOND COMPLEX/MSM) TABS Take 1 tablet by mouth daily.      . montelukast (SINGULAIR) 10 MG tablet Take 10 mg by mouth at bedtime.    . Multiple Vitamins-Minerals (MULTIVITAMIN WITH MINERALS) tablet Take 1 tablet by mouth daily. Reported on 04/10/2015    . oxyCODONE-acetaminophen (PERCOCET/ROXICET) 5-325 MG per tablet Take 2 tablets by mouth every 4 (four) hours as needed for pain. (Patient not taking: Reported on 04/10/2015) 20 tablet 0  . sertraline (ZOLOFT) 25 MG tablet Take 25 mg by mouth daily. Reported on 04/10/2015    . Vardenafil HCl (LEVITRA PO) Take by mouth as needed. Reported on 04/10/2015     No current facility-administered medications for this encounter.    REVIEW OF SYSTEMS:  On review of systems, the patient reports that he is doing well overall. He denies any chest pain, shortness of breath, cough, fevers, chills, night sweats, unintended weight changes. He denies any bowel disturbances, and  denies abdominal pain, nausea or vomiting. He denies any new musculoskeletal or joint aches or pains. His IPSS was 10, indicating moderate urinary symptoms with occasional weak stream. His SHIM was 5, indicating he does have erectile dysfunction. A complete review of systems is obtained and is otherwise negative.   PHYSICAL EXAM:  Wt Readings from Last 3 Encounters:  07/10/19 289 lb 9.6 oz (131.4 kg)  02/09/17 288 lb 8 oz (130.9 kg)  04/10/15 265 lb 6.9 oz (120.4 kg)   Temp Readings from Last 3 Encounters:  07/10/19 97.8 F (36.6 C)  04/10/15 98 F (36.7 C) (Oral)  06/11/12 98.5 F (36.9 C) (Oral)   BP Readings from Last 3 Encounters:  07/10/19 126/82  02/09/17 111/71  04/10/15 114/78   Pulse Readings from Last 3 Encounters:  07/10/19 85  02/09/17 91  04/10/15 90    /10  In general this is a well appearing Caucasian male in no acute distress.  He's alert and oriented x4 and appropriate throughout the examination. Cardiopulmonary assessment is  negative for acute distress and he exhibits normal effort.    KPS = 90  100 - Normal; no complaints; no evidence of disease. 90   - Able to carry on normal activity; minor signs or symptoms of disease. 80   - Normal activity with effort; some signs or symptoms of disease. 2   - Cares for self; unable to carry on normal activity or to do active work. 60   - Requires occasional assistance, but is able to care for most of his personal needs. 50   - Requires considerable assistance and frequent medical care. 53   - Disabled; requires special care and assistance. 61   - Severely disabled; hospital admission is indicated although death not imminent. 71   - Very sick; hospital admission necessary; active supportive treatment necessary. 10   - Moribund; fatal processes progressing rapidly. 0     - Dead  Karnofsky DA, Abelmann WH, Craver LS and Burchenal JH [phone]) The use of the nitrogen mustards in the palliative treatment of carcinoma:  with particular reference to bronchogenic carcinoma Cancer 1 [id number]   LABORATORY DATA:  No results found for: WBC, HGB, HCT, MCV, PLT No results found for: NA, K, CL, CO2 No results found for: ALT, AST, GGT, ALKPHOS, BILITOT   RADIOGRAPHY: No results found.    IMPRESSION/PLAN: 69 y.o. gentleman with Stage T1c adenocarcinoma of the prostate with a PSA of 9.53 and a Gleason score of 4+3.    We discussed the patient's workup and outlined the nature of prostate cancer in this setting. The patient's T stage, Gleason's score, and PSA put him into the unfavorable intermediate risk group. Accordingly, he is eligible for a variety of potential treatment options including prostatectomy or ST-ADT in combination with either brachytherapy or 5.5 weeks of external radiation. We discussed the available radiation techniques, and focused on the details and logistics of delivery. The patient is not an ideal candidate for brachytherapy with a prostate volume of 99 cc, therefore our recommendation would be to proceed with daily external beam radiotherapy. We discussed and outlined the risks, benefits, short and long-term effects associated with external beam radiotherapy and compared and contrasted these with prostatectomy. We discussed the role of SpaceOAR gel in reducing the rectal toxicity associated with radiotherapy. We also detailed the role of ST-ADT in the treatment of unfavorable intermediate risk prostate cancer and outlined the associated side effects that could be expected with this therapy.  He and his wife were encouraged to ask questions that were answered to their stated satisfaction.  At the end of the conversation, the patient is interested in moving forward with 5.5 weeks of external beam therapy concurrent with ST-ADT. He has not started ADT. We will share our discussion with Dr. Alinda Money and make arrangements for start of ADT, first available.  We will also coordinate for fiducial markers and SpaceOAR  gel placement in late August or early September 2021, in anticipation of beginning his daily radiation treatments sometime after Labor Day. The patient appears to have a good understanding of his disease and our treatment recommendations which are of curative intent and is in agreement with the stated plan.  Therefore, we will move forward with treatment planning accordingly.     Nicholos Johns, PA-C    Tyler Pita, MD  [facility] Direct Dial: [phone]  Fax: [phone] .com  Skype  [email or web address]   This document serves as a record of services personally performed by Tyler Pita, MD  and [employer], PA-C. It was created on their behalf by Wilburn Mylar, a trained medical scribe. The creation of this record is based on the scribe's personal observations and the provider's statements to them. This document has been checked and approved by the attending provider.

## 2019-07-10 NOTE — Consult Note (Signed)
[facility]     07/10/2019   --------------------------------------------------------------------------------   Richard Pollard  MRN: [id number]  DOB: 1950/06/17, 69 year old Male  SSN: `[id number]   PRIMARY CARE:  Stephens Shire (retired), MD  REFERRING:    PROVIDER:  Raynelle Bring, M.D.  LOCATION:  [facility] [phone]     --------------------------------------------------------------------------------   CC/HPI: CC: Prostate Cancer   PCP:  Location of consult: [facility]   Richard Pollard is a 69 year old gentleman with a past medical history of hypertension, depression, and asthma who has a history of an elevated PSA dating back to 2009 when it was 4.1. 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 A repeat biopsy in May 2010 also demonstrated only HGPIN. He was lost to follow up and returned in 2014 and was noted to have a further increase in his PSA to 5.9 in 2015. An MRI at that time was not particularly concerning. 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   He elected active surveillance management. A repeat biopsy in February 2017 only demonstrated HGPIN. His next surveillance biopsy in July 2019 again demonstrated 2 out of 12 cores (same location as 2015) with Gleason 3+3=6 disease. His PSA then continued to rise and was 9.53 in April 2021. An MRI of the prostate was done on 05/16/19 that indicated a 1.8 cm lesion at the anterior apex that was felt to be similar to 2015 but was read as a PI-RADS 4 lesion. (Read was confusion) An MR/US fusion biopsy was performed on 06/22/19 demonstrating higher grade and higher volume disease with 4 out of 4 targeted biopsies positive for Gleason 3+4=7 cancer and 3 out of 12 systematic biopsies positive for Gleason 3+4=7 disease.   Family history: None.   Imaging studies:  MRI  (05/16/19): No EPE, SVI, or LAD. No bone lesions.   PMH: He has a history of hypertension, asthma, and depression.  PSH: Appendectomy.   TNM stage: cT1c N0 Mx  PSA: 9.53  Gleason score: 4+3=7 (GG 3)  Biopsy (06/22/19): 7/16 cores positive  Left: L lateral apex (20%, 3+4=7), L apex (50%, 3+4=7)  Right: R lateral apex (20%, 4+3=7)  MRI target (anterior apex): 4/4 cores, 3+4=7, 30%, 30%, 20%, 30%  Prostate volume: 99.8 cc   Nomogram  OC disease: 49%  EPE: 48%  SVI: 4%  LNI: 5%  PFS (5 year, 10 year): 80%, 68%   Urinary function: IPSS is 6.  Erectile function: SHIM score is 1.     ALLERGIES: SULFA    MEDICATIONS: Diazepam 10 mg tablet 1 tablet PO 30-60 minutes prior to procedure  Levitra  Levofloxacin 750 mg tablet Please take one tablet the morning of your biopsy.  Bupropion Hcl  Fish Oil  Furosemide 40 MG Oral Tablet Oral  Glucosamine Chondr Complex CAPS Oral  Losartan Potassium 50 MG Oral Tablet Oral  Montelukast Sodium  Sertraline HCl - 50 MG Oral Tablet Oral     GU PSH: Prostate Needle Biopsy - 06/22/2019, 08/05/2017       PSH Notes: Eye Surgery, Cataract Surgery, Tonsillectomy, Appendectomy   NON-GU PSH: Appendectomy - 2009 Eye Surgery (Unspecified) - 01/30/2019 Remove Tonsils - 2009 Surgical Pathology, Gross And Microscopic Examination For Prostate Needle - 06/22/2019, 08/05/2017     GU PMH: ED due to arterial insufficiency, Erectile dysfunction due to arterial insufficiency - 2016  Prostate Cancer, Prostate cancer - 2016      PMH Notes:   1) Prostate cancer: He was found to have a PSA of 4.1 in 2009 prompting a prostate biopsy in Dec 2009 which demonstrated HGPIN. A repeat biopsy in May 2010 also demonstrated HGPIN but no malignancy. He did not follow up as recommended until 2014 at which time his PSA remained relatively stable. His PSA further increased to 5.9 in April 2015 prompting further evaluation. PCA-3 testing and MRI evaluations were borderline concerning.  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 He has no family history of prostate cancer. After discussing options for management, he elected to proceed with active surveillance.   Dec 2009: 12 core biopsy - HGPIN at L apex, Vol 68.7 cc  May 2010: 12 core biopsy - HGPIN at L lateral apex, Vol 64.8 cc  May 2015: MRI of prostate - PI-RADS 3 lesion at right posterior base   TNM stage: cT1c Nx Mx  PSA: 5.9  Gleason score: 3+3=6  Biopsy (08/09/13): 2/16 cores -- L apex (5%, 3+3=6), R lateral apex (5%< 3+3=6)  Prostate volume: 63.5 cc   Surveillance:  Feb 2017: 12 core biopsy - HGPIN (R lateral base), Vol 76.5 cc  Jul 2019: 12 core biopsy - 2/12 cores positive, L apex lateral (5%, 3+3=6), R apex lateral (10%, 3+3=6)   Baseline urinary function: He denies significant lower urinary tract symptoms. IPSS is 2.  Baseline erectile function: He does have severe erectile dysfunction. SHIM score is 6. He is no longer responding to PDE 5 inhibitors.     NON-GU PMH: Hypertension    FAMILY HISTORY: Congestive Heart Failure - Father   SOCIAL HISTORY: Marital Status: Single Preferred Language: English; Ethnicity: Not Hispanic Or Latino; Race: White Current Smoking Status: Patient has never smoked.  Social Drinker.  Drinks 4+ caffeinated drinks per day. Patient's occupation Astronomer.     Notes: Never A Smoker, Tobacco Use, Alcohol Use, Marital History - Single   REVIEW OF SYSTEMS:    GU Review Male:   Patient denies frequent urination, hard to postpone urination, burning/ pain with urination, get up at night to urinate, leakage of urine, stream starts and stops, trouble starting your streams, and have to strain to urinate .  Gastrointestinal (Lower):   Patient denies diarrhea and constipation.  Gastrointestinal (Upper):   Patient denies nausea and vomiting.  Constitutional:   Patient denies fever, night sweats, weight  loss, and fatigue.  Skin:   Patient denies skin rash/ lesion and itching.  Eyes:   Patient denies blurred vision and double vision.  Ears/ Nose/ Throat:   Patient denies sore throat and sinus problems.  Hematologic/Lymphatic:   Patient denies swollen glands and easy bruising.  Cardiovascular:   Patient denies leg swelling and chest pains.  Respiratory:   Patient denies shortness of breath and cough.  Endocrine:   Patient denies excessive thirst.  Musculoskeletal:   Patient denies back pain and joint pain.  Neurological:   Patient denies headaches and dizziness.  Psychologic:   Patient denies depression and anxiety.   VITAL SIGNS: None   MULTI-SYSTEM PHYSICAL EXAMINATION:    Constitutional: Well-nourished. No physical deformities. Normally developed. Good grooming.     Complexity of Data:  Lab Test Review:   PSA  Records Review:   Pathology Reports, Previous Patient Records   05/04/19 02/10/19 11/17/18 07/20/18 02/10/18 04/22/17 10/08/16 04/02/16  PSA  Total  PSA 9.53 ng/mL 10.20 ng/mL 8.30 ng/mL 10.10 ng/mL 7.02 ng/mL 6.60 ng/mL 6.38 ng/mL 5.71 ng/dl    PROCEDURES: None   ASSESSMENT:      ICD-10 Details  1 GU:   Prostate Cancer - C61    PLAN:           Schedule Labs: 6 Months - PSA  Return Visit/Planned Activity: 6 Months - Office Visit          Document Letter(s):  Created for Patient: Clinical Summary         Notes:   1. Prostate cancer: He we discussed results of his most recent biopsy the and the fact that his prostate cancer is now upgraded to Gleason 4+3=7 or grade group 3 disease. We discussed the implications of this. He understands that continued active surveillance management does carry a significant risk that his cancer will progress and develop into metastatic disease. As such, I have recommended that he strongly consider therapy of curative intent at this time.   The patient was counseled about the natural history of prostate cancer and the standard treatment  options that are available for prostate cancer. It was explained to him how his age and life expectancy, clinical stage, Gleason score, and PSA affect his prognosis, the decision to proceed with additional staging studies, as well as how that information influences recommended treatment strategies. We discussed the roles for active surveillance, radiation therapy, surgical therapy, androgen deprivation, as well as ablative therapy options for the treatment of prostate cancer as appropriate to his individual cancer situation. We discussed the risks and benefits of these options with regard to their impact on cancer control and also in terms of potential adverse events, complications, and impact on quality of life particularly related to urinary and sexual function. The patient was encouraged to ask questions throughout the discussion today and all questions were answered to his stated satisfaction. In addition, the patient was provided with and/or directed to appropriate resources and literature for further education about prostate cancer and treatment options.   After a full discussion, he appears most concerned about incontinence in this appears to be leaning toward external beam radiation therapy with IM RT. He did discuss this with Dr. Tammi Klippel later this afternoon and will do this in conjunction with 6 months of androgen deprivation. We will then plan to schedule him for fiducial marker placement/SpaceOAR insertion later in the summer prior to proceeding with radiation.   Cc: Dr. Tyler Pita  Dr. Zola Button     E & M CODES: We spent 52 minutes dedicated to evaluation and management time, including face to face interaction, discussions on coordination of care, documentation, result review, and discussion with others as applicable.

## 2019-07-10 NOTE — Progress Notes (Signed)
[facility] Psychosocial Distress Screening Spiritual Care  Met with Richard Pollard and his wife Richard Pollard in [facility] to introduce [place] team/resources, reviewing distress screen per protocol.  The patient scored a 6 on the Psychosocial Distress Thermometer which indicates moderate distress. Also assessed for distress and other psychosocial needs.   ONCBCN DISTRESS SCREENING 07/10/2019  Screening Type Initial Screening  Distress experienced in past week (1-10) 6  Practical problem type Insurance;Work/school  Emotional problem type Depression;Nervousness/Anxiety;Adjusting to illness  Physical Problem type Constipation/diarrhea;Changes in urination;Sexual problems  Referral to support programs Yes   [name] was very appreciative of PMDC and his team, noting that the clinic experience has helped reduce his distress. 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 [name] notes that he has a counselor whom he will see to process clinic and health updates and for help discerning whether his current antidepressant/dosage is effective enough. His wife Richard Pollard and I are connected through the [employer].  Follow up needed: No.  Per couple, no other needs or concerns at this time, but they are aware of ongoing [place] team/programming resources. Please also page if needs arise or circumstances change. Thank you.  [place], [place], [facility] Pager [phone] Voicemail [phone]

## 2019-07-10 NOTE — Progress Notes (Signed)
                               Care Plan Summary  Name: Richard Pollard DOB: May 18, 1950   Your Medical Team:   Urologist -  Dr. Raynelle Bring, [facility]  Radiation Oncologist - Dr. Tyler Pita, [facility]   Medical Oncologist - Dr. Zola Button, [place]  Recommendations: 1) Androgen deprivation (hormone injection) 2) External beam radiation   * These recommendations are based on information available as of today's consult.      Recommendations may change depending on the results of further tests or exams.   Next Steps: 1) Dr. Lynne Logan office will schedule hormone injection   When appointments need to be scheduled, you will be contacted by [facility] and/or [facility].  Questions?  Please do not hesitate to call Richard Rue, RN, BSN, OCN at [phone] any questions or concerns.  Richard Pollard is your Oncology Nurse Navigator and is available to assist you while you're receiving your medical care at [facility].:

## 2019-07-10 NOTE — Progress Notes (Signed)
Left message to remind him of PMDC today, arriving at 12:30 pm. I reminded him to bring his completed medical forms, reviewed registration and COVID protocal. I asked him to call me if any question.

## 2019-07-19 ENCOUNTER — Encounter: Payer: Self-pay | Admitting: Medical Oncology

## 2019-07-19 NOTE — Progress Notes (Signed)
Patient called stating he had planned to start radiation after labor day but has a conflict. 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 He would like to delay start of radiation until 10/4. I told him we could get gold markers/SpaceOar gel and CT simulation done in mid September and begin radiation in early October. He was very Patent attorney and I notified [place], [place] and [place] of the above.

## 2019-07-19 NOTE — Progress Notes (Signed)
Left message as follow up to [facility]. He did receive ADT 6/30. I informed him he will start radiation in about 8 weeks. Dr. Alinda Money will place gold markers and SpaceOar gel before start. I asked him to call me with questions or concerns.

## 2019-07-26 ENCOUNTER — Telehealth: Payer: Self-pay | Admitting: *Deleted

## 2019-07-26 NOTE — Telephone Encounter (Signed)
Called patient to ask question, lvm for a return call 

## 2019-07-27 ENCOUNTER — Emergency Department (HOSPITAL_COMMUNITY): Payer: BC Managed Care – PPO

## 2019-07-27 ENCOUNTER — Emergency Department (HOSPITAL_COMMUNITY)
Admission: EM | Admit: 2019-07-27 | Discharge: 2019-07-28 | Disposition: A | Discharge status: Home / Self Care | Payer: BC Managed Care – PPO | Attending: Emergency Medicine | Admitting: Emergency Medicine

## 2019-07-27 ENCOUNTER — Other Ambulatory Visit: Payer: Self-pay

## 2019-07-27 ENCOUNTER — Encounter (HOSPITAL_COMMUNITY): Payer: Self-pay

## 2019-07-27 DIAGNOSIS — E119 Type 2 diabetes mellitus without complications: Secondary | ICD-10-CM | POA: Insufficient documentation

## 2019-07-27 DIAGNOSIS — Y929 Unspecified place or not applicable: Secondary | ICD-10-CM | POA: Insufficient documentation

## 2019-07-27 DIAGNOSIS — Y999 Unspecified external cause status: Secondary | ICD-10-CM | POA: Insufficient documentation

## 2019-07-27 DIAGNOSIS — W1839XA Other fall on same level, initial encounter: Secondary | ICD-10-CM | POA: Insufficient documentation

## 2019-07-27 DIAGNOSIS — Z79899 Other long term (current) drug therapy: Secondary | ICD-10-CM | POA: Diagnosis not present

## 2019-07-27 DIAGNOSIS — Y939 Activity, unspecified: Secondary | ICD-10-CM | POA: Diagnosis not present

## 2019-07-27 DIAGNOSIS — Z87891 Personal history of nicotine dependence: Secondary | ICD-10-CM | POA: Insufficient documentation

## 2019-07-27 DIAGNOSIS — I10 Essential (primary) hypertension: Secondary | ICD-10-CM | POA: Diagnosis not present

## 2019-07-27 DIAGNOSIS — S8991XA Unspecified injury of right lower leg, initial encounter: Secondary | ICD-10-CM | POA: Diagnosis present

## 2019-07-27 DIAGNOSIS — Z8546 Personal history of malignant neoplasm of prostate: Secondary | ICD-10-CM | POA: Insufficient documentation

## 2019-07-27 LAB — CBC WITH DIFFERENTIAL/PLATELET
Abs Immature Granulocytes: 0.01 10*3/uL (ref 0.00–0.07)
Basophils Absolute: 0.1 10*3/uL (ref 0.0–0.1)
Basophils Relative: 1 %
Eosinophils Absolute: 0.4 10*3/uL (ref 0.0–0.5)
Eosinophils Relative: 4 %
HCT: 44.2 % (ref 39.0–52.0)
Hemoglobin: 15.3 g/dL (ref 13.0–17.0)
Immature Granulocytes: 0 %
Lymphocytes Relative: 38 %
Lymphs Abs: 3.6 10*3/uL (ref 0.7–4.0)
MCH: 33.8 pg (ref 26.0–34.0)
MCHC: 34.6 g/dL (ref 30.0–36.0)
MCV: 97.6 fL (ref 80.0–100.0)
Monocytes Absolute: 0.8 10*3/uL (ref 0.1–1.0)
Monocytes Relative: 8 %
Neutro Abs: 4.7 10*3/uL (ref 1.7–7.7)
Neutrophils Relative %: 49 %
Platelets: 360 10*3/uL (ref 150–400)
RBC: 4.53 MIL/uL (ref 4.22–5.81)
RDW: 12.8 % (ref 11.5–15.5)
WBC: 9.4 10*3/uL (ref 4.0–10.5)
nRBC: 0 % (ref 0.0–0.2)

## 2019-07-27 IMAGING — CR DG KNEE COMPLETE 4+V*R*
4 series · 4 of 4 positions shown · non-contrast
Comparison: None.

CLINICAL DATA: Fall 2 weeks ago.

EXAM:
RIGHT KNEE - COMPLETE 4+ VIEW

[t knee ap right]
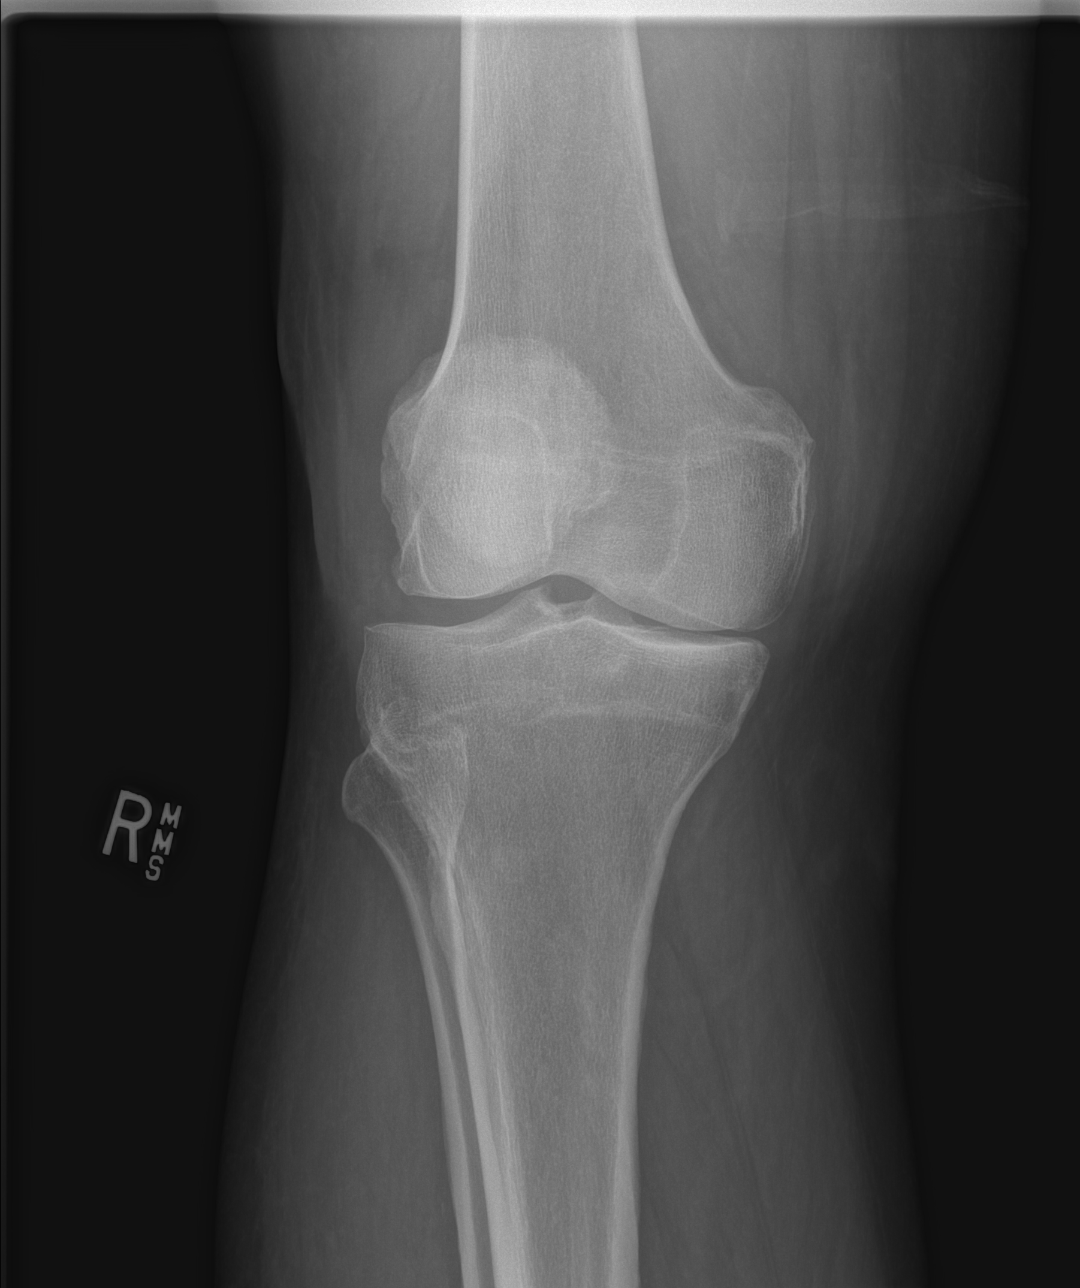

[t knee obl right (1 of 2)]
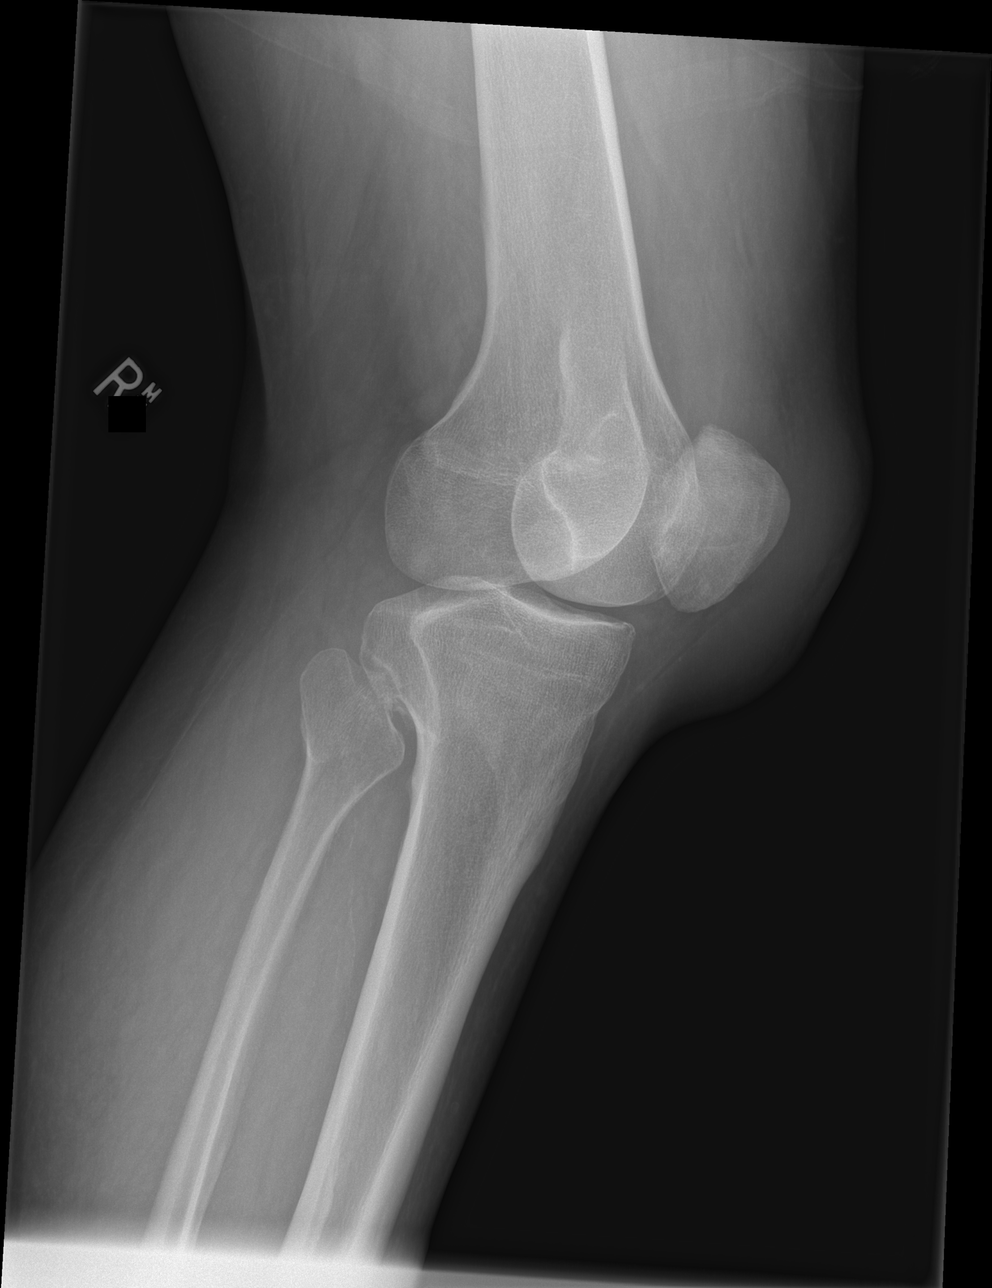

[t knee obl right (2 of 2)]
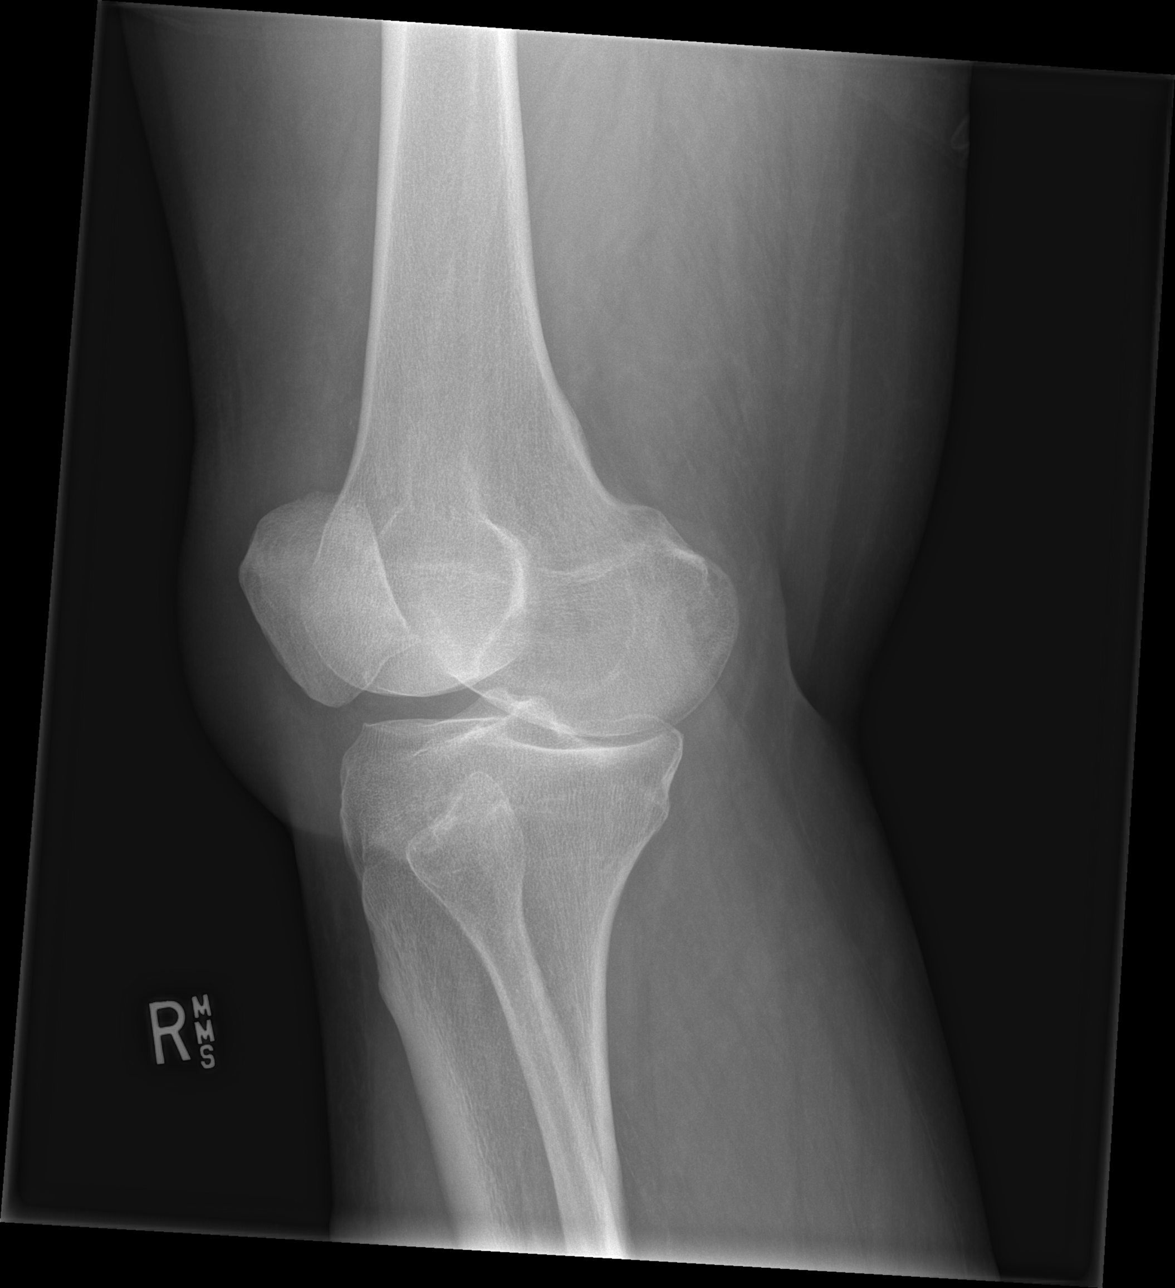

[t knee lat right]
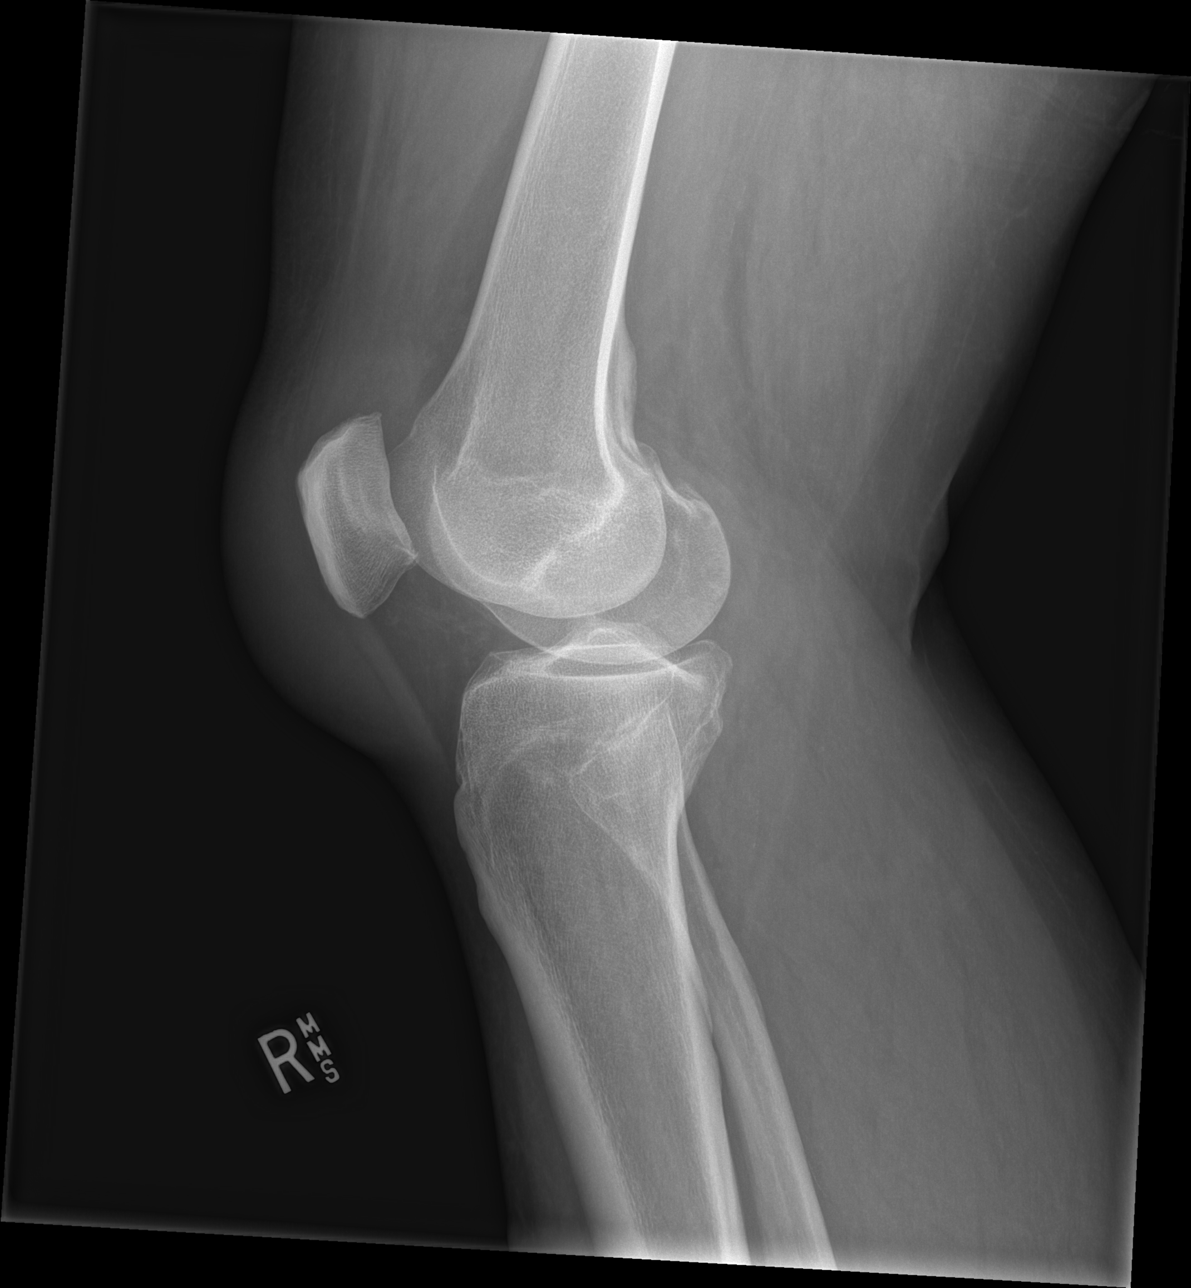

[4 of 4 positions shown; findings below may reference images not displayed]

FINDINGS: Significant soft tissue swelling anterior to the patella. No foreign
body or soft tissue gas identified in the region of swelling. No
definite joint effusion. No fractures.
IMPRESSION: Significant soft tissues swelling anterior to the patella. No other
abnormalities.

## 2019-07-27 MED ORDER — LIDOCAINE-EPINEPHRINE 2 %-1:100000 IJ SOLN: 20.0000 mL | Freq: Once | INTRAMUSCULAR | Status: DC

## 2019-07-27 MED ORDER — LIDOCAINE-EPINEPHRINE (PF) 2 %-1:200000 IJ SOLN
INTRAMUSCULAR | Status: AC
  Administered 2019-07-27: 20 mL
  Filled 2019-07-27: qty 20

## 2019-07-27 MED ORDER — LIDOCAINE-EPINEPHRINE (PF) 2 %-1:200000 IJ SOLN: 20.0000 mL | Freq: Once | INTRAMUSCULAR | Status: AC

## 2019-07-27 NOTE — ED Provider Notes (Signed)
23:45: Assumed care of patient from [place] PA-C at change of shift pending arthrocentesis results.  Arthrocentesis results. Please see prior provider note for full H&P.  Briefly patient is a 69 year old male who presented to the emergency department with right knee swelling and redness status post mechanical fall with injury 2 weeks prior.  He has been on Keflex for a period of time.  Underwent arthrocentesis by prior team which showed bloody fluid upon aspiration.  Plan at change of shift is to follow-up on labs including synovial fluid and disposition appropriately.  If findings concerning for septic joint will discuss with orthopedic surgery, if no septic joint likely discharge home with doxycycline for overlying erythema.  Labs overall reassuring.  No leukocytosis present.  Uric acid within normal limits.  Arthrocentesis synovial fluid analysis does not seem consistent with septic joint, seems most consistent with hemorrhagic effusion.  Will discharge home with doxycycline orthopedic surgery follow-up per discussion with prior team.     Amaryllis Dyke, PA-C 07/28/19 0249    Veryl Speak, MD 07/28/19 [phone]

## 2019-07-27 NOTE — ED Provider Notes (Signed)
[facility] DEPT Provider Note   CSN: [id number] Arrival date & time: 07/27/19  1604   History Chief Complaint  Patient presents with  . Knee Injury   Richard Pollard is a 69 y.o. male with past medical history significant for diabetes, prostate cancer who presents for evaluation of knee pain.  Patient states he fell approximately 2 weeks ago landing on the anterior aspect of his right knee.  Patient states he has had continued swelling and redness to anterior aspect of his right knee.  Was seen by urgent care on July 4 and received Keflex 500 mg twice daily.  He is currently been taking this.  Patient states his knee swelling has improved however he expected this would have significantly resolved given his antibiotics.  Denies any pain with range of motion or difficulty walking.  No tenderness bilateral tib-fib, femur.  Denies fever, chills, nausea, chest pain, shortness of breath abdominal pain, diarrhea, dysuria, warmth to extremities.  He is not followed by orthopedics.  Denies aggravating or alleviating factors.  History obtained from patient and past medical records.  No interpreter is used.  HPI     Past Medical History:  Diagnosis Date  . Anxiety /Depression-mild   . Arthritis   . Chest tightness   . Constipation   . Depression   . Diabetes mellitus without complication ([place])   . Dyspnea on exertion   . Erectile dysfunction   . Fatigue   . Hypertension   . Multiple allergies   . Obesity   . OSA (obstructive sleep apnea)   . Prostate cancer ([place])   . PSA elevation 07/2010    Patient Active Problem List   Diagnosis Date Noted  . 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 Essential hypertension 02/09/2017  . Subacute ethmoidal sinusitis 02/09/2017  . Morbid obesity with BMI of 40.0-44.9, adult ([place]) 02/09/2017  . Severe obstructive sleep apnea-hypopnea syndrome 02/09/2017  . Insomnia with sleep apnea 02/09/2017  .  Snoring 02/09/2017  . History of allergy 05/27/2015  . Infrapatellar bursitis 05/27/2015  . Malaise and fatigue 05/27/2015  . Sleep disturbances 05/27/2015  . Deflected nasal septum 03/12/2015  . Adiposity 03/12/2015  . Malignant neoplasm of prostate ([place]) 03/12/2015  . Allergic rhinitis 03/12/2015  . Elevated PSA 03/12/2015  . IFG (impaired fasting glucose) 03/12/2015  . Male erectile disorder 03/12/2015  . Physical exam, annual 03/12/2015  . Reactive depression (situational) 03/12/2015  . Tinea versicolor 03/12/2015  . Cornea replaced by transplant 10/09/2012  . Corneal foreign body 09/01/2011  . Atrophy, Fuchs' 03/03/2011  . Edema 02/16/2011  . 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 Dyspnea on exertion   . Chest tightness   . OSA (obstructive sleep apnea)     Past Surgical History:  Procedure Laterality Date  . APPENDECTOMY  1995  . CATARACT EXTRACTION, BILATERAL    . EYE SURGERY  2010   detached retina left eye  . EYE SURGERY     fuches dystrophy both eyes.  Marland Kitchen PROSTATE BIOPSY    . TONSILLECTOMY     childhood       Family History  Problem Relation Age of Onset  . Heart attack Father 71       after Bypass surgery at age 45  . Other Mother 36       angioplasty  . Allergic rhinitis Mother   . Coronary artery disease Other   . Angioedema Neg  Hx   . Asthma Neg Hx   . Eczema Neg Hx   . Immunodeficiency Neg Hx   . Urticaria Neg Hx   . Breast cancer Neg Hx   . Colon cancer Neg Hx   . Pancreatic cancer Neg Hx   . Prostate cancer Neg Hx     Social History   Tobacco Use  . Smoking status: Former Smoker    Types: Pipe    Start date: 02/09/1994    Quit date: 01/18/1997    Years since quitting: 22.5  . Smokeless tobacco: Never Used  . Tobacco comment: Occ Pipe Smoker during that time.  Vaping Use  . Vaping Use: Never used  Substance Use Topics  . Alcohol use: Yes    Alcohol/week: 0.0 standard drinks  . Drug  use: No    Home Medications Prior to Admission medications   Medication Sig Start Date End Date Taking? Authorizing Provider  buPROPion (WELLBUTRIN SR) 150 MG 12 hr tablet Take 1 tablet by mouth 2 (two) times daily. 06/05/19  Yes [provider]  calcium carbonate (OSCAL) 1500 (600 Ca) MG TABS tablet Take by mouth 2 (two) times daily with a meal.   Yes [provider]  cephALEXin (KEFLEX) 500 MG capsule Take 500 mg by mouth 4 (four) times daily. Pt began therapy on 07-22-19 and has completed 5 of 10 days of therapy. 07/22/19  Yes [provider]  docusate sodium (COLACE) 100 MG capsule Take 100 mg by mouth 2 (two) times daily.   Yes [provider]  etodolac (LODINE XL) 400 MG 24 hr tablet Take 400 mg by mouth daily. 07/22/19  Yes [provider]  fish oil-omega-3 fatty acids 1000 MG capsule Take 3 g by mouth daily. Reported on 04/10/2015   Yes [provider]  furosemide (LASIX) 40 MG tablet Take 40 mg by mouth 2 (two) times daily. Take one tablet by mouth twice daily. 02/16/11  Yes Deboraha Sprang, MD  Glucosamine-Chondroitin 500-400 MG CAPS Take 2 capsules by mouth daily.  03/27/08  Yes [provider]  levocetirizine (XYZAL) 5 MG tablet Take by mouth.   Yes [provider]  losartan (COZAAR) 50 MG tablet TAKE 1 TABLET BY MOUTH DAILY Patient taking differently: Take 50 mg by mouth daily.  04/04/12  Yes Deboraha Sprang, MD  meloxicam (MOBIC) 15 MG tablet Take 15 mg by mouth daily. 05/27/19  Yes [provider]  montelukast (SINGULAIR) 10 MG tablet Take 10 mg by mouth at bedtime.   Yes [provider]  prednisoLONE acetate (PRED FORTE) 1 % ophthalmic suspension Place 1 drop into the left eye daily.  02/06/19 02/06/20 Yes [provider]  terbinafine (LAMISIL) 250 MG tablet Take 250 mg by mouth daily. 06/05/19  Yes [provider]  Vardenafil HCl (LEVITRA PO) Take by mouth as needed. Reported on  04/10/2015 Patient not taking: Reported on 07/10/2019    [provider]    Allergies    Sulfa antibiotics  Review of Systems   Review of Systems  Constitutional: Negative.   HENT: Negative.   Respiratory: Negative.   Cardiovascular: Negative.   Gastrointestinal: Negative.   Genitourinary: Negative.   Musculoskeletal: Positive for arthralgias. Negative for back pain, gait problem, neck pain and neck stiffness.       Redness and swelling to right knee  Skin: Positive for wound.  Neurological: Negative.   All other systems reviewed and are negative.   Physical Exam Updated Vital  Signs BP 140/86   Pulse 79   Temp 97.7 F (36.5 C) (Oral)   Resp 20   Ht 6' (1.829 m)   Wt 131.1 kg   SpO2 96%   BMI 39.20 kg/m   Physical Exam Vitals and nursing note reviewed.  Constitutional:      General: He is not in acute distress.    Appearance: He is well-developed. He is not ill-appearing, toxic-appearing or diaphoretic.  HENT:     Head: Normocephalic and atraumatic.     Nose: Nose normal.     Mouth/Throat:     Mouth: Mucous membranes are moist.  Eyes:     Pupils: Pupils are equal, round, and reactive to light.  Cardiovascular:     Rate and Rhythm: Normal rate and regular rhythm.     Pulses: Normal pulses.          Radial pulses are 2+ on the right side and 2+ on the left side.       Dorsalis pedis pulses are 2+ on the right side and 2+ on the left side.     Heart sounds: Normal heart sounds.  Pulmonary:     Effort: Pulmonary effort is normal. No respiratory distress.  Abdominal:     General: Bowel sounds are normal. There is no distension.     Palpations: Abdomen is soft.  Musculoskeletal:        General: Normal range of motion.     Cervical back: Normal range of motion and neck supple.     Right upper leg: Normal.     Left upper leg: Normal.     Right knee: Swelling and effusion present. No lacerations, bony tenderness or crepitus. Normal range of motion.  Tenderness present.     Left knee: Normal.     Right lower leg: Normal.     Left lower leg: Normal.     Comments: Erythema and swelling with soft tissue collection to anterior right knee.  He is able to fully range of flexion extension without difficulty.  No overlying warmth.  Does have abrasion to anterior right knee.  Able to straight leg raise without difficulty.  No bony tenderness to femur or tib-fib.  Skin:    General: Skin is warm and dry.     Capillary Refill: Capillary refill takes less than 2 seconds.     Comments: Erythema soft tissue swelling to right knee.  No warmth.  Neurological:     General: No focal deficit present.     Mental Status: He is alert.     Cranial Nerves: Cranial nerves are intact.     Sensory: Sensation is intact.     Motor: Motor function is intact.     Coordination: Coordination is intact.     Gait: Gait is intact.     Comments: Ambulatory but difficulty 5/5 strength bilateral lower extremities without difficulty. Intact sensation       ED Results / Procedures / Treatments   Labs (all labs ordered are listed, but only abnormal results are displayed) Labs Reviewed  BASIC METABOLIC PANEL - Abnormal; Notable for the following components:      Result Value   Glucose, Bld 106 (*)    All other components within normal limits  BODY FLUID CULTURE  CBC WITH DIFFERENTIAL/PLATELET  URIC ACID  GLUCOSE, BODY FLUID OTHER  PROTEIN, BODY FLUID (OTHER)  SYNOVIAL CELL COUNT + DIFF, W/ CRYSTALS  URIC ACID, BODY FLUID    EKG None  Radiology DG  Knee Complete 4 Views Right  Result Date: 07/27/2019 CLINICAL DATA:  Fall 2 weeks ago. EXAM: RIGHT KNEE - COMPLETE 4+ VIEW COMPARISON:  None. FINDINGS: Significant soft tissue swelling anterior to the patella. No foreign body or soft tissue gas identified in the region of swelling. No definite joint effusion. No fractures. IMPRESSION: Significant soft tissues swelling anterior to the patella. No other  abnormalities. Electronically Signed   By: Dorise Bullion III M.D   On: 07/27/2019 18:00   Procedures .Joint Aspiration/Arthrocentesis  Date/Time: 07/27/2019 11:05 PM Performed by: Nettie Elm, PA-C Authorized by: Nettie Elm, PA-C   Consent:    Consent obtained:  Verbal   Consent given by:  Patient   Risks discussed:  Bleeding, infection, pain, poor cosmetic result, nerve damage and incomplete drainage   Alternatives discussed:  No treatment, delayed treatment, alternative treatment, observation and referral Location:    Location:  Knee   Knee:  R knee Anesthesia (see MAR for exact dosages):    Anesthesia method:  Local infiltration   Local anesthetic:  Lidocaine 1% WITH epi Procedure details:    Preparation: Patient was prepped and draped in usual sterile fashion     Needle gauge:  18 G   Ultrasound guidance: no     Approach:  Lateral   Aspirate amount:  45cc   Aspirate characteristics:  Bloody   Steroid injected: no     Specimen collected: yes   Post-procedure details:    Dressing:  Sterile dressing and gauze roll   Patient tolerance of procedure:  Tolerated well, no immediate complications   (including critical care time)  Medications Ordered in ED Medications  lidocaine-EPINEPHrine (XYLOCAINE W/EPI) 2 %-1:200000 (PF) injection 20 mL (20 mLs Infiltration Given by Other 07/27/19 2300)    ED Course  I have reviewed the triage vital signs and the nursing notes.  Pertinent labs & imaging results that were available during my care of the patient were reviewed by me and considered in my medical decision making (see chart for details).  69 year old male presents for evaluation of right knee swelling and redness which began 2 weeks ago after mechanical fall.  He did not hit his head or lose LOC.  He is afebrile, nonseptic, non-ill-appearing.  Seen by urgent care 5 days ago started on Keflex for possible infection.  Patient with erythematous right knee with some  overlying contusions.  No associated warmth.  There is significant soft tissue swelling which which feels like fluid collection.  Heart and lungs clear.  He looks overall well.  He has full range of motion to bilateral knees without difficulty and without any pain.  He is neurovascularly intact.  Patient assessed by attending, Dr. Darl Householder.  Recommends labs.  He did perform bedside ultrasound.  Recommends arthrocentesis fluid collection.  I was able to perform arthrocentesis, see procedure note. Aspiration not over area of cellulitis or erythema. 45 cc grossly bloody fluid removed from right knee. Question hematoma from fall?  Plan on sending for labs and fluid cultures.  Plain film x-ray personally reviewed and interpreted with large soft tissue swelling anterior to patella.  Symptoms do not seem consistent with septic joint.  Dr. Darl Householder does not recommend ESR CRP at this time.  Pressure dressing placed after arthrocentesis.  Care transferred to [facility], PA-C who will follow up on labs, arthrocentesis.  If arthrocentesis is negative for infectious process and no grossly abnormal labs patient likely to DC home with orthopedic follow-up. If infectious arthrocentesis findings  plan to consult with Ortho.  Patient seen evaluated by attending, Dr. Darl Householder who agrees with above treatment, plan and disposition.     MDM Rules/Calculators/A&P                           Final Clinical Impression(s) / ED Diagnoses Final diagnoses:  Right knee injury    Rx / DC Orders ED Discharge Orders    None       Nakesha Ebrahim A, PA-C 07/28/19 0009    Drenda Freeze, MD 07/30/19 [phone]

## 2019-07-27 NOTE — ED Triage Notes (Signed)
Patient states he fell approx 2 weeks ago. Patient currently has redness and swelling of the right knee. Patient states he went to an UC on 07/22/19 and received Cephalexin 500 mg bid and is still taking at this time.

## 2019-07-28 LAB — BASIC METABOLIC PANEL
Anion gap: 11 (ref 5–15)
BUN: 20 mg/dL (ref 8–23)
CO2: 27 mmol/L (ref 22–32)
Calcium: 9.6 mg/dL (ref 8.9–10.3)
Chloride: 102 mmol/L (ref 98–111)
Creatinine, Ser: 1.15 mg/dL (ref 0.61–1.24)
GFR calc Af Amer: >60 mL/min (ref >60)
GFR calc non Af Amer: >60 mL/min (ref >60)
Glucose, Bld: 106 mg/dL — ABNORMAL HIGH (ref 70–99)
Potassium: 4.1 mmol/L (ref 3.5–5.1)
Sodium: 140 mmol/L (ref 135–145)

## 2019-07-28 LAB — SYNOVIAL CELL COUNT + DIFF, W/ CRYSTALS
Crystals, Fluid: NONE SEEN
Eosinophils-Synovial: 1 % (ref 0–1)
Lymphocytes-Synovial Fld: 34 % — ABNORMAL HIGH (ref 0–20)
Monocyte-Macrophage-Synovial Fluid: 31 % — ABNORMAL LOW (ref 50–90)
Neutrophil, Synovial: 34 % — ABNORMAL HIGH (ref 0–25)
Other Cells-SYN: 0
WBC, Synovial: 1067 /cu mm — ABNORMAL HIGH (ref 0–200)

## 2019-07-28 LAB — URIC ACID: Uric Acid, Serum: 6 mg/dL (ref 3.7–8.6)

## 2019-07-28 MED ORDER — DOXYCYCLINE HYCLATE 100 MG PO CAPS
100.0000 mg | ORAL_CAPSULE | Freq: Two times a day (BID) | ORAL | 0 refills | Status: DC
Start: 2019-07-28 — End: 2020-07-22

## 2019-07-28 NOTE — Discharge Instructions (Addendum)
You were seen in the emergency department today for continued problems related to a knee injury. Your labs were overall reassuring.  The fluid analyzed from your knee did not show signs of an obvious infection, it shows findings of blood which is likely from the traumatic injury you experience.  Due to the overlying redness on your knee we are prescribing you doxycycline, this is an antibiotic, please take this as prescribed.  We have prescribed you new medication(s) today. Discuss the medications prescribed today with your pharmacist as they can have adverse effects and interactions with your other medicines including over the counter and prescribed medications. Seek medical evaluation if you start to experience new or abnormal symptoms after taking one of these medicines, seek care immediately if you start to experience difficulty breathing, feeling of your throat closing, facial swelling, or rash as these could be indications of a more serious allergic reaction  Please apply ice wrapped in a towel 20 minutes on 40 minutes off for the next 48 hours.  You may apply an Ace wrap for compression.  Keep the area elevated as much as possible.  Please follow-up with your primary care provider and/or orthopedics within the next 3 to 5 days for reevaluation.  Return to the ER for new or worsening symptoms including but not limited to worsening pain, spreading redness, fever, chills, inability to move the knee, or any other concerns.

## 2019-07-29 LAB — GLUCOSE, BODY FLUID OTHER: Glucose, Body Fluid Other: 50 mg/dL

## 2019-07-29 LAB — URIC ACID, BODY FLUID: Uric Acid Body Fluid: 5.2 mg/dL

## 2019-07-29 LAB — PROTEIN, BODY FLUID (OTHER): Total Protein, Body Fluid Other: 4.5 g/dL

## 2019-07-31 LAB — BODY FLUID CULTURE: Culture: NO GROWTH

## 2019-08-03 ENCOUNTER — Telehealth: Payer: Self-pay | Admitting: *Deleted

## 2019-08-03 MED FILL — CEPHALEXIN 500 MG CAPSULE: 500 | 1 days supply | Qty: 2 | Fill #0

## 2019-08-03 NOTE — Telephone Encounter (Signed)
CALLED PATIENT TO INFORM OF FID. MARKER AND SPACE OAR PLACEMENT ON 09-11-19 @ 12:30 PM @ [facility] AND HIS SIM ON 09-14-19 @ 8AM @  DR.  MANNING'S OFFICE, LVM FOR A RETURN CALL

## 2019-08-08 ENCOUNTER — Telehealth: Payer: Self-pay | Admitting: *Deleted

## 2019-08-08 NOTE — Telephone Encounter (Signed)
CALLED PATIENT TO ASK QUESTION, LVM FOR A RETURN CALL 

## 2019-08-09 ENCOUNTER — Other Ambulatory Visit: Payer: Self-pay | Admitting: Urology

## 2019-08-09 DIAGNOSIS — C61 Malignant neoplasm of prostate: Secondary | ICD-10-CM

## 2019-08-20 MED FILL — DIAZEPAM 10 MG TABS: 10 | 1 days supply | Qty: 1 | Fill #0

## 2019-09-14 ENCOUNTER — Ambulatory Visit: Payer: BC Managed Care – PPO | Admitting: Radiation Oncology

## 2019-09-25 MED FILL — PREDNISOLONE AC 1% EYE DROP: 1 | 30 days supply | Qty: 10 | Fill #1

## 2019-09-29 ENCOUNTER — Other Ambulatory Visit: Payer: Self-pay

## 2019-09-29 ENCOUNTER — Ambulatory Visit: Payer: BC Managed Care – PPO | Attending: Internal Medicine

## 2019-09-29 DIAGNOSIS — Z23 Encounter for immunization: Secondary | ICD-10-CM

## 2019-09-29 NOTE — Progress Notes (Signed)
   Covid-19 Vaccination Clinic  Name:  RENAN DANESE    MRN: [id number] DOB: 1950/03/23  09/29/2019  Mr. Walling was observed post Covid-19 immunization for 15 minutes without incident. He was provided with Vaccine Information Sheet and instruction to access the V-Safe system.   Mr. Dusenbery was instructed to call 911 with any severe reactions post vaccine: Marland Kitchen Difficulty breathing  . Swelling of face and throat  . A fast heartbeat  . A bad rash all over body  . Dizziness and weakness

## 2019-10-11 ENCOUNTER — Telehealth: Payer: Self-pay | Admitting: *Deleted

## 2019-10-11 NOTE — Telephone Encounter (Signed)
Called patient to inform that sim appt. for 10-12-19 needs to be cancelled and rescheduled due to patient needing an MRI, lvm for a return call

## 2019-10-12 ENCOUNTER — Ambulatory Visit: Payer: BC Managed Care – PPO | Admitting: Radiation Oncology

## 2019-10-12 ENCOUNTER — Encounter: Payer: Self-pay | Admitting: Medical Oncology

## 2019-10-12 NOTE — Progress Notes (Signed)
Returned call to Richard Pollard- wife, regarding appointments. She states patient's CT simulation appointment was cancelled yesterday due to a MRI. I explained that the MRI has not been approved by his insurance and until we get approval, we cannot schedule. He spoke with Ailene Ravel yesterday about this. She states he has used all his vacation time at work coming to appointments and asked if we can schedule his radiation after 3:30 pm. I told her we will work with him when scheduling. She is aware Enid Derry will contact her once the approval is received. She voiced appreciation for the return call. I asked her to reach out to me with questions or concerns. She voiced understanding.

## 2019-10-19 ENCOUNTER — Telehealth: Payer: Self-pay | Admitting: *Deleted

## 2019-10-19 NOTE — Telephone Encounter (Signed)
CALLED PATIENT TO INFORM OF CT FOR 10-23-19 AND HIS MRI FOR 10-25-19, SPOKE WITH PATIENT AND HE IS AWARE OF THESE APPTS.

## 2019-10-22 ENCOUNTER — Telehealth: Payer: Self-pay | Admitting: *Deleted

## 2019-10-22 NOTE — Progress Notes (Signed)
  Radiation Oncology         [phone] ________________________________  Name: Richard Pollard MRN: [id number]  Date: 10/23/2019  DOB: 17-Apr-1950  SIMULATION AND TREATMENT PLANNING NOTE    ICD-10-CM   1. Malignant neoplasm of prostate ([place])  C61     DIAGNOSIS:  69 y.o. gentleman with stage T1c adenocarcinoma of the prostate with a Gleason's score of 4+3 and a PSA of 9.53  NARRATIVE:  The patient was brought to the [facility].  Identity was confirmed.  All relevant records and images related to the planned course of therapy were reviewed.  The patient freely provided informed written consent to proceed with treatment after reviewing the details related to the planned course of therapy. The consent form was witnessed and verified by the simulation staff.  Then, the patient was set-up in a stable reproducible supine position for radiation therapy.  A vacuum lock pillow device was custom fabricated to position his legs in a reproducible immobilized position.  Then, I performed a urethrogram under sterile conditions to identify the prostatic apex.  CT images were obtained.  Surface markings were placed.  The CT images were loaded into the planning software.  Then the prostate target and avoidance structures including the rectum, bladder, bowel and hips were contoured.  Treatment planning then occurred.  The radiation prescription was entered and confirmed.  A total of one complex treatment devices was fabricated. I have requested : Intensity Modulated Radiotherapy (IMRT) is medically necessary for this case for the following reason:  Rectal sparing.Marland Kitchen  PLAN:  The patient will receive 70 Gy in 28 fractions.  ________________________________  Sheral Apley Tammi Klippel, M.D.

## 2019-10-22 NOTE — Telephone Encounter (Signed)
Called patient  to remind of sim appt. For 10-23-19 - arrival time- 7:45 am, lvm for a return call

## 2019-10-23 ENCOUNTER — Ambulatory Visit
Admission: RE | Admit: 2019-10-23 | Discharge: 2019-10-23 | Disposition: A | Discharge status: Home / Self Care | Payer: BC Managed Care – PPO | Source: Ambulatory Visit | Attending: Radiation Oncology | Admitting: Radiation Oncology

## 2019-10-23 ENCOUNTER — Other Ambulatory Visit: Payer: Self-pay

## 2019-10-23 ENCOUNTER — Encounter: Payer: Self-pay | Admitting: Medical Oncology

## 2019-10-23 DIAGNOSIS — Z510 Encounter for antineoplastic radiation therapy: Secondary | ICD-10-CM | POA: Diagnosis present

## 2019-10-23 DIAGNOSIS — C61 Malignant neoplasm of prostate: Secondary | ICD-10-CM | POA: Diagnosis not present

## 2019-10-25 ENCOUNTER — Ambulatory Visit (HOSPITAL_COMMUNITY): Payer: BC Managed Care – PPO

## 2019-10-25 ENCOUNTER — Encounter (HOSPITAL_COMMUNITY): Payer: Self-pay

## 2019-10-25 ENCOUNTER — Telehealth: Payer: Self-pay | Admitting: *Deleted

## 2019-10-25 NOTE — Telephone Encounter (Signed)
CALLED PATIENT TO INFORM THAT MRI IS CANCELLED DUE TO SCANNER BEING DOWN, AND THAT THEY WILL PROCEED TX. WITHOUT MRI, LVM FOR A RETURN CALL

## 2019-10-30 ENCOUNTER — Telehealth: Payer: Self-pay | Admitting: *Deleted

## 2019-10-30 DIAGNOSIS — C61 Malignant neoplasm of prostate: Secondary | ICD-10-CM | POA: Diagnosis not present

## 2019-10-30 NOTE — Telephone Encounter (Signed)
Called patient to inform that Ashlyn Bruning isn't here today and that I am sure that she will call him tomorrow.

## 2019-10-31 ENCOUNTER — Encounter (HOSPITAL_COMMUNITY): Payer: Self-pay

## 2019-10-31 ENCOUNTER — Telehealth: Payer: Self-pay | Admitting: *Deleted

## 2019-10-31 ENCOUNTER — Ambulatory Visit (HOSPITAL_COMMUNITY): Payer: BC Managed Care – PPO

## 2019-10-31 NOTE — Telephone Encounter (Signed)
CALLED PATIENT TO INFORM THAT HE DOESN'T NEED AN MRI, SPOKE WITH PATIENT AND HE IS AWARE OF THIS

## 2019-11-01 ENCOUNTER — Ambulatory Visit
Admission: RE | Admit: 2019-11-01 | Discharge: 2019-11-01 | Disposition: A | Discharge status: Home / Self Care | Payer: BC Managed Care – PPO | Source: Ambulatory Visit | Attending: Radiation Oncology | Admitting: Radiation Oncology

## 2019-11-01 ENCOUNTER — Other Ambulatory Visit: Payer: Self-pay

## 2019-11-01 DIAGNOSIS — C61 Malignant neoplasm of prostate: Secondary | ICD-10-CM | POA: Diagnosis not present

## 2019-11-02 ENCOUNTER — Ambulatory Visit
Admission: RE | Admit: 2019-11-02 | Discharge: 2019-11-02 | Disposition: A | Discharge status: Home / Self Care | Payer: BC Managed Care – PPO | Source: Ambulatory Visit | Attending: Radiation Oncology | Admitting: Radiation Oncology

## 2019-11-02 DIAGNOSIS — C61 Malignant neoplasm of prostate: Secondary | ICD-10-CM | POA: Diagnosis not present

## 2019-11-05 ENCOUNTER — Ambulatory Visit
Admission: RE | Admit: 2019-11-05 | Discharge: 2019-11-05 | Disposition: A | Discharge status: Home / Self Care | Payer: BC Managed Care – PPO | Source: Ambulatory Visit | Attending: Radiation Oncology | Admitting: Radiation Oncology

## 2019-11-05 DIAGNOSIS — C61 Malignant neoplasm of prostate: Secondary | ICD-10-CM | POA: Diagnosis not present

## 2019-11-06 ENCOUNTER — Ambulatory Visit
Admission: RE | Admit: 2019-11-06 | Discharge: 2019-11-06 | Disposition: A | Discharge status: Home / Self Care | Payer: BC Managed Care – PPO | Source: Ambulatory Visit | Attending: Radiation Oncology | Admitting: Radiation Oncology

## 2019-11-06 ENCOUNTER — Other Ambulatory Visit: Payer: Self-pay

## 2019-11-06 DIAGNOSIS — C61 Malignant neoplasm of prostate: Secondary | ICD-10-CM | POA: Diagnosis not present

## 2019-11-07 ENCOUNTER — Ambulatory Visit
Admission: RE | Admit: 2019-11-07 | Discharge: 2019-11-07 | Disposition: A | Discharge status: Home / Self Care | Payer: BC Managed Care – PPO | Source: Ambulatory Visit | Attending: Radiation Oncology | Admitting: Radiation Oncology

## 2019-11-07 DIAGNOSIS — C61 Malignant neoplasm of prostate: Secondary | ICD-10-CM | POA: Diagnosis not present

## 2019-11-08 ENCOUNTER — Ambulatory Visit
Admission: RE | Admit: 2019-11-08 | Discharge: 2019-11-08 | Disposition: A | Discharge status: Home / Self Care | Payer: BC Managed Care – PPO | Source: Ambulatory Visit | Attending: Radiation Oncology | Admitting: Radiation Oncology

## 2019-11-08 DIAGNOSIS — C61 Malignant neoplasm of prostate: Secondary | ICD-10-CM | POA: Diagnosis not present

## 2019-11-09 ENCOUNTER — Other Ambulatory Visit: Payer: Self-pay | Admitting: Radiation Oncology

## 2019-11-09 ENCOUNTER — Ambulatory Visit
Admission: RE | Admit: 2019-11-09 | Discharge: 2019-11-09 | Disposition: A | Discharge status: Home / Self Care | Payer: BC Managed Care – PPO | Source: Ambulatory Visit | Attending: Radiation Oncology | Admitting: Radiation Oncology

## 2019-11-09 DIAGNOSIS — C61 Malignant neoplasm of prostate: Secondary | ICD-10-CM | POA: Diagnosis not present

## 2019-11-09 MED ORDER — ALFUZOSIN HCL ER 10 MG PO TB24
10.0000 mg | ORAL_TABLET | Freq: Every day | ORAL | 5 refills | Status: DC
Start: 2019-11-09 — End: 2019-11-16

## 2019-11-09 MED FILL — ALFUZOSIN HCL ER 10 MG TAB: 10 | 30 days supply | Qty: 30 | Fill #0

## 2019-11-12 ENCOUNTER — Ambulatory Visit
Admission: RE | Admit: 2019-11-12 | Discharge: 2019-11-12 | Disposition: A | Discharge status: Home / Self Care | Payer: BC Managed Care – PPO | Source: Ambulatory Visit | Attending: Radiation Oncology | Admitting: Radiation Oncology

## 2019-11-12 DIAGNOSIS — C61 Malignant neoplasm of prostate: Secondary | ICD-10-CM | POA: Diagnosis not present

## 2019-11-13 ENCOUNTER — Ambulatory Visit
Admission: RE | Admit: 2019-11-13 | Discharge: 2019-11-13 | Disposition: A | Discharge status: Home / Self Care | Payer: BC Managed Care – PPO | Source: Ambulatory Visit | Attending: Radiation Oncology | Admitting: Radiation Oncology

## 2019-11-13 ENCOUNTER — Telehealth: Payer: Self-pay | Admitting: Radiation Oncology

## 2019-11-13 DIAGNOSIS — C61 Malignant neoplasm of prostate: Secondary | ICD-10-CM | POA: Diagnosis not present

## 2019-11-13 NOTE — Telephone Encounter (Signed)
Received message from Romie Jumper, nurse secretary, that patient request a call reference medication reaction. Phoned patient to inquire. Patient explains that Dr. Tammi Klippel prescribed him Alfuzosin on Friday. He goes onto say he took one tablet Saturday morning and began having shortness of breath causing him to gasp for air. Also, patient reports feeling very fatigue and sleeping for several hours. Instructed patient to stop medication immediately. Patient confirms he hasn't taken another pill since Saturday morning.    Patient confirms he continues to struggle with urinary frequency, urgency, hesitancy and dribbling urine. Denies hematuria and nocturia. Reports he has to sit to void. Reports it is very painful to urinate. Patient questions if there is another medication he could take to manage his LUTS. Patient confirms his preferred pharmacy is [facility] pharmacy.   Patient takes Lasix 40 mg bid. Patient has a sulfa sensitivity.  69 y.o. gentleman with stage T1c adenocarcinoma of the prostate with a Gleason's score of 4+3 and a PSA of 9.53. Prostate volume: 99.8 grams.   Patient understands this RN will inform the providers of these findings and phone him back later today with directions.

## 2019-11-14 ENCOUNTER — Ambulatory Visit
Admission: RE | Admit: 2019-11-14 | Discharge: 2019-11-14 | Disposition: A | Discharge status: Home / Self Care | Payer: BC Managed Care – PPO | Source: Ambulatory Visit | Attending: Radiation Oncology | Admitting: Radiation Oncology

## 2019-11-14 DIAGNOSIS — C61 Malignant neoplasm of prostate: Secondary | ICD-10-CM | POA: Diagnosis not present

## 2019-11-15 ENCOUNTER — Ambulatory Visit
Admission: RE | Admit: 2019-11-15 | Discharge: 2019-11-15 | Disposition: A | Discharge status: Home / Self Care | Payer: BC Managed Care – PPO | Source: Ambulatory Visit | Attending: Radiation Oncology | Admitting: Radiation Oncology

## 2019-11-15 DIAGNOSIS — C61 Malignant neoplasm of prostate: Secondary | ICD-10-CM | POA: Diagnosis not present

## 2019-11-15 NOTE — Telephone Encounter (Signed)
Since he has a sulfa allergy, I do not recommend trying Flomax so I think it would be best for him to call his urologist for recommendations instead of stabbing at another medication that he might have a reaction to. -Briony Parveen

## 2019-11-16 ENCOUNTER — Ambulatory Visit
Admission: RE | Admit: 2019-11-16 | Discharge: 2019-11-16 | Disposition: A | Discharge status: Home / Self Care | Payer: BC Managed Care – PPO | Source: Ambulatory Visit | Attending: Radiation Oncology | Admitting: Radiation Oncology

## 2019-11-16 DIAGNOSIS — C61 Malignant neoplasm of prostate: Secondary | ICD-10-CM | POA: Diagnosis not present

## 2019-11-19 ENCOUNTER — Ambulatory Visit
Admission: RE | Admit: 2019-11-19 | Discharge: 2019-11-19 | Disposition: A | Discharge status: Home / Self Care | Payer: BC Managed Care – PPO | Source: Ambulatory Visit | Attending: Radiation Oncology | Admitting: Radiation Oncology

## 2019-11-19 DIAGNOSIS — C61 Malignant neoplasm of prostate: Secondary | ICD-10-CM | POA: Diagnosis not present

## 2019-11-19 DIAGNOSIS — Z510 Encounter for antineoplastic radiation therapy: Secondary | ICD-10-CM | POA: Diagnosis present

## 2019-11-20 ENCOUNTER — Ambulatory Visit
Admission: RE | Admit: 2019-11-20 | Discharge: 2019-11-20 | Disposition: A | Discharge status: Home / Self Care | Payer: BC Managed Care – PPO | Source: Ambulatory Visit | Attending: Radiation Oncology | Admitting: Radiation Oncology

## 2019-11-20 DIAGNOSIS — C61 Malignant neoplasm of prostate: Secondary | ICD-10-CM | POA: Diagnosis not present

## 2019-11-21 ENCOUNTER — Other Ambulatory Visit: Payer: Self-pay

## 2019-11-21 ENCOUNTER — Ambulatory Visit
Admission: RE | Admit: 2019-11-21 | Discharge: 2019-11-21 | Disposition: A | Discharge status: Home / Self Care | Payer: BC Managed Care – PPO | Source: Ambulatory Visit | Attending: Radiation Oncology | Admitting: Radiation Oncology

## 2019-11-21 DIAGNOSIS — C61 Malignant neoplasm of prostate: Secondary | ICD-10-CM | POA: Diagnosis not present

## 2019-11-22 ENCOUNTER — Other Ambulatory Visit: Payer: Self-pay

## 2019-11-22 ENCOUNTER — Ambulatory Visit
Admission: RE | Admit: 2019-11-22 | Discharge: 2019-11-22 | Disposition: A | Discharge status: Home / Self Care | Payer: BC Managed Care – PPO | Source: Ambulatory Visit | Attending: Radiation Oncology | Admitting: Radiation Oncology

## 2019-11-22 DIAGNOSIS — C61 Malignant neoplasm of prostate: Secondary | ICD-10-CM | POA: Diagnosis not present

## 2019-11-23 ENCOUNTER — Ambulatory Visit
Admission: RE | Admit: 2019-11-23 | Discharge: 2019-11-23 | Disposition: A | Discharge status: Home / Self Care | Payer: BC Managed Care – PPO | Source: Ambulatory Visit | Attending: Radiation Oncology | Admitting: Radiation Oncology

## 2019-11-23 DIAGNOSIS — C61 Malignant neoplasm of prostate: Secondary | ICD-10-CM | POA: Diagnosis not present

## 2019-11-26 ENCOUNTER — Ambulatory Visit
Admission: RE | Admit: 2019-11-26 | Discharge: 2019-11-26 | Disposition: A | Discharge status: Home / Self Care | Payer: BC Managed Care – PPO | Source: Ambulatory Visit | Attending: Radiation Oncology | Admitting: Radiation Oncology

## 2019-11-26 DIAGNOSIS — C61 Malignant neoplasm of prostate: Secondary | ICD-10-CM | POA: Diagnosis not present

## 2019-11-27 ENCOUNTER — Ambulatory Visit
Admission: RE | Admit: 2019-11-27 | Discharge: 2019-11-27 | Disposition: A | Discharge status: Home / Self Care | Payer: BC Managed Care – PPO | Source: Ambulatory Visit | Attending: Radiation Oncology | Admitting: Radiation Oncology

## 2019-11-27 DIAGNOSIS — C61 Malignant neoplasm of prostate: Secondary | ICD-10-CM | POA: Diagnosis not present

## 2019-11-28 ENCOUNTER — Ambulatory Visit
Admission: RE | Admit: 2019-11-28 | Discharge: 2019-11-28 | Disposition: A | Discharge status: Home / Self Care | Payer: BC Managed Care – PPO | Source: Ambulatory Visit | Attending: Radiation Oncology | Admitting: Radiation Oncology

## 2019-11-28 DIAGNOSIS — C61 Malignant neoplasm of prostate: Secondary | ICD-10-CM | POA: Diagnosis not present

## 2019-11-29 ENCOUNTER — Ambulatory Visit
Admission: RE | Admit: 2019-11-29 | Discharge: 2019-11-29 | Disposition: A | Discharge status: Home / Self Care | Payer: BC Managed Care – PPO | Source: Ambulatory Visit | Attending: Radiation Oncology | Admitting: Radiation Oncology

## 2019-11-29 ENCOUNTER — Other Ambulatory Visit: Payer: Self-pay

## 2019-11-29 DIAGNOSIS — C61 Malignant neoplasm of prostate: Secondary | ICD-10-CM | POA: Diagnosis not present

## 2019-11-30 ENCOUNTER — Ambulatory Visit
Admission: RE | Admit: 2019-11-30 | Discharge: 2019-11-30 | Disposition: A | Discharge status: Home / Self Care | Payer: BC Managed Care – PPO | Source: Ambulatory Visit | Attending: Radiation Oncology | Admitting: Radiation Oncology

## 2019-11-30 DIAGNOSIS — C61 Malignant neoplasm of prostate: Secondary | ICD-10-CM | POA: Diagnosis not present

## 2019-12-03 ENCOUNTER — Ambulatory Visit
Admission: RE | Admit: 2019-12-03 | Discharge: 2019-12-03 | Disposition: A | Discharge status: Home / Self Care | Payer: BC Managed Care – PPO | Source: Ambulatory Visit | Attending: Radiation Oncology | Admitting: Radiation Oncology

## 2019-12-03 DIAGNOSIS — C61 Malignant neoplasm of prostate: Secondary | ICD-10-CM | POA: Diagnosis not present

## 2019-12-04 ENCOUNTER — Ambulatory Visit
Admission: RE | Admit: 2019-12-04 | Discharge: 2019-12-04 | Disposition: A | Discharge status: Home / Self Care | Payer: BC Managed Care – PPO | Source: Ambulatory Visit | Attending: Radiation Oncology | Admitting: Radiation Oncology

## 2019-12-04 DIAGNOSIS — C61 Malignant neoplasm of prostate: Secondary | ICD-10-CM | POA: Diagnosis not present

## 2019-12-05 ENCOUNTER — Ambulatory Visit
Admission: RE | Admit: 2019-12-05 | Discharge: 2019-12-05 | Disposition: A | Discharge status: Home / Self Care | Payer: BC Managed Care – PPO | Source: Ambulatory Visit | Attending: Radiation Oncology | Admitting: Radiation Oncology

## 2019-12-05 DIAGNOSIS — C61 Malignant neoplasm of prostate: Secondary | ICD-10-CM | POA: Diagnosis not present

## 2019-12-06 ENCOUNTER — Ambulatory Visit
Admission: RE | Admit: 2019-12-06 | Discharge: 2019-12-06 | Disposition: A | Discharge status: Home / Self Care | Payer: BC Managed Care – PPO | Source: Ambulatory Visit | Attending: Radiation Oncology | Admitting: Radiation Oncology

## 2019-12-06 DIAGNOSIS — C61 Malignant neoplasm of prostate: Secondary | ICD-10-CM | POA: Diagnosis not present

## 2019-12-07 ENCOUNTER — Ambulatory Visit
Admission: RE | Admit: 2019-12-07 | Discharge: 2019-12-07 | Disposition: A | Discharge status: Home / Self Care | Payer: BC Managed Care – PPO | Source: Ambulatory Visit | Attending: Radiation Oncology | Admitting: Radiation Oncology

## 2019-12-07 DIAGNOSIS — C61 Malignant neoplasm of prostate: Secondary | ICD-10-CM | POA: Diagnosis not present

## 2019-12-10 ENCOUNTER — Ambulatory Visit
Admission: RE | Admit: 2019-12-10 | Discharge: 2019-12-10 | Disposition: A | Discharge status: Home / Self Care | Payer: BC Managed Care – PPO | Source: Ambulatory Visit | Attending: Radiation Oncology | Admitting: Radiation Oncology

## 2019-12-10 ENCOUNTER — Encounter: Payer: Self-pay | Admitting: Medical Oncology

## 2019-12-10 ENCOUNTER — Encounter: Payer: Self-pay | Admitting: Urology

## 2019-12-10 DIAGNOSIS — C61 Malignant neoplasm of prostate: Secondary | ICD-10-CM | POA: Diagnosis not present

## 2019-12-17 MED FILL — PREDNISOLONE AC 1% EYE DROP: 1 | 30 days supply | Qty: 10 | Fill #2

## 2020-01-14 ENCOUNTER — Telehealth: Payer: Self-pay

## 2020-01-14 ENCOUNTER — Encounter: Payer: Self-pay | Admitting: Urology

## 2020-01-14 ENCOUNTER — Other Ambulatory Visit: Payer: Self-pay

## 2020-01-14 NOTE — Progress Notes (Signed)
Patient has a 1 month telephone follow-up appointment with Ashlyn Bruning PA. Patient denies having nocturia. Patient denies having any dysuria. Patient states that he is emptying his bladder completely. Patient states urine stream is strong to weak and intermittent. Patient states that he has constipation. Patient denies urgency. Patient denies leakage. Patient states that he has an upcoming appointment with [facility].

## 2020-01-14 NOTE — Telephone Encounter (Signed)
Spoke with patient in regards to 1 month telephone follow-up appointment with Marcello Fennel PA on 01/17/20 @ 1:00pm. Patient verbalized understanding of appointment date and time. Reviewed meaningful use, AUA and prostate questions.TM

## 2020-01-17 ENCOUNTER — Other Ambulatory Visit: Payer: Self-pay

## 2020-01-17 ENCOUNTER — Ambulatory Visit
Admission: RE | Admit: 2020-01-17 | Discharge: 2020-01-17 | Disposition: A | Discharge status: Home / Self Care | Payer: Self-pay | Source: Ambulatory Visit | Attending: Urology | Admitting: Urology

## 2020-01-17 DIAGNOSIS — C61 Malignant neoplasm of prostate: Secondary | ICD-10-CM

## 2020-01-17 NOTE — Progress Notes (Addendum)
  Radiation Oncology         [phone] ________________________________  Name: Richard Pollard MRN: [id number]  Date: 12/10/2019  DOB: 1950-01-20  End of Treatment Note  Diagnosis:   69 y.o. gentleman with stage T1c adenocarcinoma of the prostate with a Gleason's score of 4+3 and a PSA of 9.53     Indication for treatment:  Curative, Definitive Radiotherapy concurrent with ST-ADT (6 month Eligard 07/18/19)      Radiation treatment dates:   11/01/19 - 12/10/19  Site/dose:   The prostate was treated to 70 Gy in 28 fractions of 2.5 Gy  Beams/energy:   The patient was treated with IMRT using volumetric arc therapy delivering 6 MV X-rays to clockwise and counterclockwise circumferential arcs with a 90 degree collimator offset to avoid dose scalloping.  Image guidance was performed with daily cone beam CT prior to each fraction to align to gold markers in the prostate and assure proper bladder and rectal fill volumes.  Immobilization was achieved with BodyFix custom mold.  Narrative: The patient tolerated radiation treatment relatively well with only minor urinary irritation and modest fatigue.  He did experience increased hesitancy and nocturia 1-2 times per night but specifically denied dysuria, gross hematuria, excessive daytime frequency, urgency, straining to void, incomplete emptying or incontinence.  He reported occasional loose bowel movements but no outright diarrhea.  Plan: The patient has completed radiation treatment. He will return to radiation oncology clinic for routine followup in one month. I advised him to call or return sooner if he has any questions or concerns related to his recovery or treatment. ________________________________  Artist Pais. Kathrynn Running, M.D.

## 2020-01-17 NOTE — Progress Notes (Signed)
Radiation Oncology         [phone] ________________________________  Name: Richard Pollard MRN: [id number]  Date: 01/17/2020  DOB: March 26, 1950  Post Treatment Note  CC: [facility]  [facility] Purpura, MD  Diagnosis:   69 y.o. gentleman with stage T1c adenocarcinoma of the prostate with a Gleason's score of 4+3 and a PSA of 9.53  Interval Since Last Radiation:  5.5 weeks 11/01/19 - 12/10/19:  The prostate was treated to 70 Gy in 28 fractions of 2.5 Gy concurrent with ST-ADT (6 month Eligard 07/18/19)  Narrative:  I spoke with the patient to conduct his routine scheduled 1 month follow up visit via telephone to spare the patient unnecessary potential exposure in the healthcare setting during the current COVID-19 pandemic.  The patient was notified in advance and gave permission to proceed with this visit format.  He tolerated radiation treatment relatively well with only minor urinary irritation and modest fatigue.  He did experience increased hesitancy and nocturia 1-2 times per night but specifically denied dysuria, gross hematuria, excessive daytime frequency, urgency, straining to void, incomplete emptying or incontinence.  He reported occasional loose bowel movements but no outright diarrhea.                              On review of systems, the patient states that he is doing very well in general. He reports almost complete resolution of his LUTS with only occasional hesitancy/intermittency of the urine stream. He specifically denies dysuria, gross hematuria, excessive daytime frequency, urgency, incomplete bladder emptying or incontinence. He reports a healthy appetite and is maintaining his weight. He continues to tolerate the ADT fairly well despite mild fatigue and occasional hot flashes. He denies any abdominal pain, nausea, vomiting, or diarrhea but has continued with his chronic constipation. Overall, he is quite pleased with his progress to  date.  ALLERGIES:  is allergic to alfuzosin and sulfa antibiotics.  Meds: Current Outpatient Medications  Medication Sig Dispense Refill  . buPROPion (WELLBUTRIN SR) 150 MG 12 hr tablet Take 1 tablet by mouth 2 (two) times daily.    . calcium carbonate (OSCAL) 1500 (600 Ca) MG TABS tablet Take by mouth 2 (two) times daily with a meal.    . cephALEXin (KEFLEX) 500 MG capsule Take 500 mg by mouth 4 (four) times daily. Pt began therapy on 07-22-19 and has completed 5 of 10 days of therapy.    . docusate sodium (COLACE) 100 MG capsule Take 100 mg by mouth 2 (two) times daily.    Marland Kitchen doxycycline (VIBRAMYCIN) 100 MG capsule Take 1 capsule (100 mg total) by mouth 2 (two) times daily. 14 capsule 0  . etodolac (LODINE XL) 400 MG 24 hr tablet Take 400 mg by mouth daily.    . fish oil-omega-3 fatty acids 1000 MG capsule Take 3 g by mouth daily. Reported on 04/10/2015    . furosemide (LASIX) 40 MG tablet Take 40 mg by mouth 2 (two) times daily. Take one tablet by mouth twice daily.    . Glucosamine-Chondroitin 500-400 MG CAPS Take 2 capsules by mouth daily.     Marland Kitchen levocetirizine (XYZAL) 5 MG tablet Take by mouth.    . losartan (COZAAR) 50 MG tablet TAKE 1 TABLET BY MOUTH DAILY (Patient taking differently: Take 50 mg by mouth daily.) 90 tablet 3  . meloxicam (MOBIC) 15 MG tablet Take 15 mg by mouth daily.    Marland Kitchen  montelukast (SINGULAIR) 10 MG tablet Take 10 mg by mouth at bedtime.    . prednisoLONE acetate (PRED FORTE) 1 % ophthalmic suspension Place 1 drop into the left eye daily.     Marland Kitchen terbinafine (LAMISIL) 250 MG tablet Take 250 mg by mouth daily.     No current facility-administered medications for this encounter.    Physical Findings:  vitals were not taken for this visit.   /Unable to assess due to telephone follow-up visit format.  Lab Findings: Lab Results  Component Value Date   WBC 9.4 07/27/2019   HGB 15.3 07/27/2019   HCT 44.2 07/27/2019   MCV 97.6 07/27/2019   PLT 360 07/27/2019      Radiographic Findings: No results found.  Impression/Plan: 1. 69 y.o. gentleman with stage T1c adenocarcinoma of the prostate with a Gleason's score of 4+3 and a PSA of 9.53. He will continue to follow up with urology for ongoing PSA determinations and has an appointment scheduled for labs today at 3:30 PM and a follow-up visit with Dr. Alinda Money next week. He understands what to expect with regards to PSA monitoring going forward. I will look forward to following his response to treatment via correspondence with urology, and would be happy to continue to participate in his care if clinically indicated. I talked to the patient about what to expect in the future, including his risk for erectile dysfunction and rectal bleeding. I encouraged him to call or return to the office if he has any questions regarding his previous radiation or possible radiation side effects. He was comfortable with this plan and will follow up as needed.    Nicholos Johns, PA-C

## 2020-05-16 ENCOUNTER — Ambulatory Visit: Payer: Self-pay | Admitting: Family Medicine

## 2020-07-22 ENCOUNTER — Ambulatory Visit (INDEPENDENT_AMBULATORY_CARE_PROVIDER_SITE_OTHER): Payer: Medicare Other | Admitting: Family Medicine

## 2020-07-22 ENCOUNTER — Encounter: Payer: Self-pay | Admitting: Family Medicine

## 2020-07-22 ENCOUNTER — Other Ambulatory Visit: Payer: Self-pay

## 2020-07-22 VITALS — BP 115/75 | HR 78 | Temp 97.4°F | Resp 15 | Ht 72.0 in | Wt 277.4 lb

## 2020-07-22 DIAGNOSIS — C61 Malignant neoplasm of prostate: Secondary | ICD-10-CM

## 2020-07-22 DIAGNOSIS — I10 Essential (primary) hypertension: Secondary | ICD-10-CM

## 2020-07-22 DIAGNOSIS — G4700 Insomnia, unspecified: Secondary | ICD-10-CM

## 2020-07-22 DIAGNOSIS — R600 Localized edema: Secondary | ICD-10-CM

## 2020-07-22 DIAGNOSIS — J309 Allergic rhinitis, unspecified: Secondary | ICD-10-CM

## 2020-07-22 DIAGNOSIS — H18519 Endothelial corneal dystrophy, unspecified eye: Secondary | ICD-10-CM

## 2020-07-22 DIAGNOSIS — R739 Hyperglycemia, unspecified: Secondary | ICD-10-CM

## 2020-07-22 DIAGNOSIS — M1990 Unspecified osteoarthritis, unspecified site: Secondary | ICD-10-CM | POA: Diagnosis not present

## 2020-07-22 DIAGNOSIS — G4733 Obstructive sleep apnea (adult) (pediatric): Secondary | ICD-10-CM

## 2020-07-22 DIAGNOSIS — F320 Major depressive disorder, single episode, mild: Secondary | ICD-10-CM

## 2020-07-22 LAB — TSH: TSH: 2.68 u[IU]/mL (ref 0.35–5.50)

## 2020-07-22 LAB — CBC
HCT: 39.1 % (ref 39.0–52.0)
Hemoglobin: 13.6 g/dL (ref 13.0–17.0)
MCHC: 34.8 g/dL (ref 30.0–36.0)
MCV: 97.8 fl (ref 78.0–100.0)
Platelets: 305 10*3/uL (ref 150.0–400.0)
RBC: 4 Mil/uL — ABNORMAL LOW (ref 4.22–5.81)
RDW: 12.8 % (ref 11.5–15.5)
WBC: 6 10*3/uL (ref 4.0–10.5)

## 2020-07-22 LAB — COMPREHENSIVE METABOLIC PANEL
ALT: 19 U/L (ref 0–53)
AST: 15 U/L (ref 0–37)
Albumin: 4 g/dL (ref 3.5–5.2)
Alkaline Phosphatase: 65 U/L (ref 39–117)
BUN: 14 mg/dL (ref 6–23)
CO2: 29 mEq/L (ref 19–32)
Calcium: 9.6 mg/dL (ref 8.4–10.5)
Chloride: 104 mEq/L (ref 96–112)
Creatinine, Ser: 1.08 mg/dL (ref 0.40–1.50)
GFR: 70.7 mL/min (ref >60.00)
Glucose, Bld: 156 mg/dL — ABNORMAL HIGH (ref 70–99)
Potassium: 4.2 mEq/L (ref 3.5–5.1)
Sodium: 141 mEq/L (ref 135–145)
Total Bilirubin: 0.5 mg/dL (ref 0.2–1.2)
Total Protein: 7 g/dL (ref 6.0–8.3)

## 2020-07-22 LAB — HEMOGLOBIN A1C: Hgb A1c MFr Bld: 7.2 % — ABNORMAL HIGH (ref 4.6–6.5)

## 2020-07-22 NOTE — Assessment & Plan Note (Signed)
Follows with ophthalmology.  Also has a history of retinal detachment.  Is on prednisolone drops daily.

## 2020-07-22 NOTE — Assessment & Plan Note (Signed)
Not currently on CPAP.  Did not feel like it was beneficial.  May at some point in the future be interested in looking into inspire device.

## 2020-07-22 NOTE — Assessment & Plan Note (Signed)
On Lasix 40 mg twice daily.  Tolerating well.

## 2020-07-22 NOTE — Assessment & Plan Note (Addendum)
Follows with urology every 6 months.  Has had seed placement.  PSA remains low.

## 2020-07-22 NOTE — Assessment & Plan Note (Signed)
Takes temazepam 15 mg nightly as needed.  Tolerating well.  Minimal side effects.

## 2020-07-22 NOTE — Progress Notes (Signed)
Richard Pollard is a 70 y.o. male who presents today for an office visit.  He is a new patient.   Assessment/Plan:  New/Acute Problems: Constipation Possibly due to dehydration.  Could be side effect of medications as well.  We will check labs including CBC, c-Met, and TSH.  Potentially could have diabetes which is contributing as well.  Recommended MiraLAX and fiber supplementation  Dry mouth Possibly side effect of medications though we will check above labs.  Chronic Problems Addressed Today: Malignant neoplasm of prostate [facility]) Follows with urology every 6 months.  Has had seed placement.  PSA remains low.   Fuchs' corneal dystrophy Follows with ophthalmology.  Also has a history of retinal detachment.  Is on prednisolone drops daily.  Insomnia Takes temazepam 15 mg nightly as needed.  Tolerating well.  Minimal side effects.  Osteoarthritis Overall stable.  Continue meloxicam 15 mg daily as needed.  Also recommended compression and ice.  Hyperglycemia Reportedly was found to have borderline blood sugar on his last visit with his previous PCP about 6 months ago.  We will recheck A1c.  Also check CBC and c-Met.  Check TSH.  Depression, major, single episode, mild (HCC) Stable on Wellbutrin 150 mg twice daily.  Allergic rhinitis On Xyzal 5 mg daily, Singulair 10 mg nightly, and Flonase.  Xyzal and Singulair could be contributing to dry mouth/constipation.  Severe obstructive sleep apnea-hypopnea syndrome Not currently on CPAP.  Did not feel like it was beneficial.  May at some point in the future be interested in looking into inspire device.  Essential hypertension At goal.  Continue losartan 50 mg daily.  Leg edema On Lasix 40 mg twice daily.  Tolerating well.     Subjective:  HPI:  See A/P for status of chronic conditions.  He has been having issues with constipation for the past few months.  Seems to be getting worse.  Had a bowel movement every 3 to 4 days.   Sometimes has to strain to pass a bowel movement.  He is using Colace without significant improvement.  He has additionally been having issues with excessive urination and dry mouth.  Also feels thirsty frequently.  ROS: Per HPI, otherwise a complete review of systems was negative.   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 Added automatically from request for surgery [id number]   Multiple allergies    Obesity    OSA (obstructive sleep apnea)    Prostate cancer ([place])    PSA elevation 07/2010   Tinea versicolor 03/12/2015   Patient Active Problem List   Diagnosis Date Noted   Hyperglycemia 07/22/2020   Osteoarthritis 07/22/2020   Insomnia 07/22/2020   Fuchs' corneal dystrophy 07/22/2020   Leg edema 07/22/2020   Essential hypertension 02/09/2017   Severe obstructive sleep apnea-hypopnea syndrome 02/09/2017   Malignant neoplasm of prostate ([place]) 03/12/2015   Allergic rhinitis 03/12/2015   Depression, major, single episode, mild ([place]) 03/12/2015   Past Surgical History:  Procedure Laterality Date  APPENDECTOMY  1995   CATARACT EXTRACTION, BILATERAL     EYE SURGERY  2010   detached retina left eye   EYE SURGERY     fuches dystrophy both eyes.   PROSTATE BIOPSY     TONSILLECTOMY     childhood    Family History  Problem Relation Age of Onset   Heart attack Father 42       after Bypass surgery at age 33   Other Mother 55       angioplasty   Allergic rhinitis Mother    Coronary artery disease Other    Angioedema Neg Hx     Asthma Neg Hx    Eczema Neg Hx    Immunodeficiency Neg Hx    Urticaria Neg Hx    Breast cancer Neg Hx    Colon cancer Neg Hx    Pancreatic cancer Neg Hx    Prostate cancer Neg Hx     Medications- reviewed and updated Current Outpatient Medications  Medication Sig Dispense Refill   buPROPion (WELLBUTRIN SR) 150 MG 12 hr tablet Take 1 tablet by mouth 2 (two) times daily.     calcium carbonate (OSCAL) 1500 (600 Ca) MG TABS tablet Take by mouth 2 (two) times daily with a meal.     docusate sodium (COLACE) 100 MG capsule Take 100 mg by mouth 2 (two) times daily.     fish oil-omega-3 fatty acids 1000 MG capsule Take 3 g by mouth daily. Reported on 04/10/2015     furosemide (LASIX) 40 MG tablet Take 40 mg by mouth 2 (two) times daily. Take one tablet by mouth twice daily.     Glucosamine-Chondroitin 500-400 MG CAPS Take 2 capsules by mouth daily.      levocetirizine (XYZAL) 5 MG tablet Take by mouth.     losartan (COZAAR) 50 MG tablet TAKE 1 TABLET BY MOUTH DAILY (Patient taking differently: Take 50 mg by mouth daily.) 90 tablet 3   meloxicam (MOBIC) 15 MG tablet Take 15 mg by mouth daily.     montelukast (SINGULAIR) 10 MG tablet Take 10 mg by mouth at bedtime.     fluticasone (FLONASE) 50 MCG/ACT nasal spray Place 1 spray into both nostrils daily.     prednisoLONE acetate (PRED FORTE) 1 % ophthalmic suspension Place 1 drop into the left eye daily.     temazepam (RESTORIL) 15 MG capsule Take by mouth at bedtime as needed.     No current facility-administered medications for this visit.    Allergies-reviewed and updated Allergies  Allergen Reactions   Alfuzosin Shortness Of Breath    Shortness of breath   Sulfa Antibiotics Hives and Rash    Social History   Socioeconomic History   Marital status: Married    Spouse name: Gay Filler   Number of children: 0   Years of education: Not on file   Highest education level: Not on file  Occupational History    Comment: technician  Tobacco Use    Smoking status: Former    Pack years: 0.00    Types: Pipe    Start date: 02/09/1994    Quit date: 01/18/1997    Years since quitting: 23.5   Smokeless tobacco: Never   Tobacco comments:    Occ Pipe Smoker during that time.  Vaping Use   Vaping Use: Never used  Substance and Sexual Activity   Alcohol use: Yes    Alcohol/week: 0.0 standard drinks   Drug use: No   Sexual activity:  Not Currently  Other Topics Concern   Not on file  Social History Narrative   Not on file   Social Determinants of Health   Financial Resource Strain: Not on file  Food Insecurity: Not on file  Transportation Needs: Not on file  Physical Activity: Not on file  Stress: Not on file  Social Connections: Not on file        Objective:  Physical Exam: BP 115/75   Pulse 78   Temp (!) 97.4 F (36.3 C)   Resp 15   Ht 6' (1.829 m)   Wt 277 lb 6.4 oz (125.8 kg)   SpO2 98%   BMI 37.62 kg/m   Gen: No acute distress, resting comfortably CV: Regular rate and rhythm with no murmurs appreciated Pulm: Normal work of breathing, clear to auscultation bilaterally with no crackles, wheezes, or rhonchi MSK: Bilateral knees with crepitus on active and passive range of motion.  No joint tenderness.  Neurovascular intact distally.  Varicose veins noted to bilateral lower extremities. Neuro: Grossly normal, moves all extremities Psych: Normal affect and thought content  Time Spent: 62 minutes of total time was spent on the date of the encounter performing the following actions: chart review prior to seeing the patient including recent specialist visits and visits with prvious PCP, obtaining history, performing a medically necessary exam, counseling on the treatment plan, placing orders, and documenting in our EHR.        Algis Greenhouse. Jerline Pain, MD 07/22/2020 10:02 AM

## 2020-07-22 NOTE — Patient Instructions (Signed)
It was very nice to see you today!  We will check blood work today.  Your constipation and dry mouth could be coming from your medications but we will make sure there is nothing else is going on first.  We will see you back in 3 to 6 months depending on results your blood work.  Take care, Dr Jerline Pain  PLEASE NOTE:  If you had any lab tests please let us know if you have not heard back within a few days. You may see your results on mychart before we have a chance to review them but we will give you a call once they are reviewed by [place]. If we ordered any referrals today, please let us know if you have not heard from their office within the next week.   Please try these tips to maintain a healthy lifestyle:  Eat at least 3 REAL meals and 1-2 snacks per day.  Aim for no more than 5 hours between eating.  If you eat breakfast, please do so within one hour of getting up.   Each meal should contain half fruits/vegetables, one quarter protein, and one quarter carbs (no bigger than a computer mouse)  Cut down on sweet beverages. This includes juice, soda, and sweet tea.   Drink at least 1 glass of water with each meal and aim for at least 8 glasses per day  Exercise at least 150 minutes every week.

## 2020-07-22 NOTE — Assessment & Plan Note (Signed)
Overall stable.  Continue meloxicam 15 mg daily as needed.  Also recommended compression and ice.

## 2020-07-22 NOTE — Assessment & Plan Note (Signed)
Reportedly was found to have borderline blood sugar on his last visit with his previous PCP about 6 months ago.  We will recheck A1c.  Also check CBC and c-Met.  Check TSH.

## 2020-07-22 NOTE — Assessment & Plan Note (Signed)
At goal.  Continue losartan 50 mg daily. 

## 2020-07-22 NOTE — Assessment & Plan Note (Signed)
Stable on Wellbutrin 150 mg twice daily.

## 2020-07-22 NOTE — Assessment & Plan Note (Signed)
On Xyzal 5 mg daily, Singulair 10 mg nightly, and Flonase.  Xyzal and Singulair could be contributing to dry mouth/constipation.

## 2020-07-23 ENCOUNTER — Other Ambulatory Visit: Payer: Self-pay | Admitting: *Deleted

## 2020-07-23 ENCOUNTER — Other Ambulatory Visit (HOSPITAL_COMMUNITY): Payer: Self-pay

## 2020-07-23 MED ORDER — METFORMIN HCL 500 MG PO TABS
500.0000 mg | ORAL_TABLET | Freq: Every day | ORAL | 1 refills | Status: AC
Start: 2020-07-23 — End: 2020-10-26
  Filled 2020-07-23: qty 90, 90d supply, fill #0

## 2020-07-23 NOTE — Progress Notes (Signed)
Please inform patient of the following:  A1c is elevated and in the diabetic range.  This could explain some of the symptoms he has been experiencing.  Recommend starting metformin 500 mg daily and we should recheck again in 3 months.  He should continue to work on diet and exercise as well.  Everything else is normal.

## 2020-07-28 ENCOUNTER — Other Ambulatory Visit (HOSPITAL_COMMUNITY): Payer: Self-pay

## 2020-10-11 ENCOUNTER — Other Ambulatory Visit (HOSPITAL_COMMUNITY): Payer: Self-pay

## 2020-10-27 ENCOUNTER — Telehealth: Payer: Self-pay | Admitting: Family Medicine

## 2020-10-27 NOTE — Telephone Encounter (Signed)
Spoke with patient he decided not to keep seeing  Dr Jerline Pain.  He is staying at [facility].  He thought his pcp was leaving practice.

## 2021-02-10 ENCOUNTER — Telehealth: Payer: Self-pay | Admitting: Family Medicine

## 2021-02-10 NOTE — Telephone Encounter (Signed)
Spoke with patient he stated his pcp is at [facility].  He would not give pcp name.

## 2021-03-25 ENCOUNTER — Other Ambulatory Visit: Payer: Self-pay | Admitting: Physician Assistant

## 2021-03-25 DIAGNOSIS — R209 Unspecified disturbances of skin sensation: Secondary | ICD-10-CM

## 2021-03-25 DIAGNOSIS — R0989 Other specified symptoms and signs involving the circulatory and respiratory systems: Secondary | ICD-10-CM

## 2021-03-27 ENCOUNTER — Ambulatory Visit
Admission: RE | Admit: 2021-03-27 | Discharge: 2021-03-27 | Disposition: A | Discharge status: Home / Self Care | Payer: Medicare Other | Source: Ambulatory Visit | Attending: Physician Assistant | Admitting: Physician Assistant

## 2021-03-27 DIAGNOSIS — R209 Unspecified disturbances of skin sensation: Secondary | ICD-10-CM

## 2021-03-27 DIAGNOSIS — R0989 Other specified symptoms and signs involving the circulatory and respiratory systems: Secondary | ICD-10-CM

## 2021-10-12 ENCOUNTER — Encounter: Payer: Self-pay | Admitting: *Deleted

## 2021-11-19 ENCOUNTER — Encounter: Payer: Self-pay | Admitting: Nurse Practitioner

## 2021-11-19 ENCOUNTER — Ambulatory Visit (INDEPENDENT_AMBULATORY_CARE_PROVIDER_SITE_OTHER): Payer: Medicare Other | Admitting: Nurse Practitioner

## 2021-11-19 VITALS — BP 108/74 | HR 112 | Ht 72.0 in | Wt 276.8 lb

## 2021-11-19 DIAGNOSIS — E669 Obesity, unspecified: Secondary | ICD-10-CM | POA: Diagnosis not present

## 2021-11-19 DIAGNOSIS — G4731 Primary central sleep apnea: Secondary | ICD-10-CM

## 2021-11-19 NOTE — Assessment & Plan Note (Signed)
BMI 37. Healthy weight loss encouraged 

## 2021-11-19 NOTE — Assessment & Plan Note (Signed)
He has a history of complex sleep apnea with primarily central events. He was treated with BiPAP therapy, which he has stopped using and has been off of for a few years. He has snoring, excessive daytime sleepiness, nocturnal gasping, nightmares. BMI 37. Epworth 8. Given this,  I am concerned he still has sleep disordered breathing with sleep apnea. He will need an in lab sleep study for further evaluation.    - discussed how weight can impact sleep and risk for sleep disordered breathing - discussed options to assist with weight loss: combination of diet modification, cardiovascular and strength training exercises   - had an extensive discussion regarding the adverse health consequences related to untreated sleep disordered breathing - specifically discussed the risks for hypertension, coronary artery disease, cardiac dysrhythmias, cerebrovascular disease, and diabetes - lifestyle modification discussed   - discussed how sleep disruption can increase risk of accidents, particularly when driving - safe driving practices were discussed

## 2021-11-19 NOTE — Progress Notes (Signed)
$'@Patient'y$  ID: Richard Pollard, male    DOB: 1950/03/27, 71 y.o.   MRN: [id number]  Chief Complaint  Patient presents with   Consult    Pt sleep consult, pt experiences daytime tiredness, snoring. Denies apneic events    Referring provider: Heywood Pollard, *  HPI: 71 year old male, former smoker referred for sleep consult.  Past medical history significant for IIC, depression, insomnia, prediabetes.  TEST/EVENTS:  2013 PSG: AHI 12.3/h, 6.7 central apneas 2019 PSG: AHI 18/h, mostly central apneas; supine AHI 55.6/h >>transitioned to BiPAP 16/12  11/19/2021: Today - sleep consult Patient presents today for sleep consult referred by Richard Lundborg, PA.  He has a history of complex sleep apnea.  Last sleep study was 2019.  He was started on BiPAP.  Found himself tossing and turning at night and would end up with the mask off and on the floor so he stopped using it. He has been struggling with daytime fatigue and naps throughout the day recently so his PCP advised he have repeat evaluation. He has been told by his wife that he snores. No witnessed apneas. Occasionally has dreams that he has weights or piles of blankets on his chest and wakes up feeling claustrophobic. He denies any morning headaches, drowsy driving, sleep parasomnias/paralysis. No history of narcolepsy or cataplexy.  He goes to bed around midnight. Falls asleep within half an hour. Wakes up 4-5 times a night. Officially gets out of the bed in the morning around 8:30/9 am. He used to take restoril to help him sleep but hasn't been using this much. He does take gabapentin and flexeril. No other sedating medications. Weight is up probably 30 pounds by his guess since previous studies.  He lives at home with his wife. He is retired. Former smoker; quit 1996. Drinks one alcoholic beverage a day.  He has a history of hypertension, controlled on antihypertensive. No history of stroke.   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 Added automatically from request for surgery [id number]  Multiple allergies    Obesity    OSA (obstructive sleep apnea)    [place]state cancer [place]    PSA elevation 07/2010   Tinea versicolor 03/12/2015    Tobacco History: Social History   Tobacco Use  Smoking Status Former   Types: Pipe   Start date: 02/09/1994   Quit date: 01/18/1997   Years since quitting: 24.8  Smokeless Tobacco Never  Tobacco Comments   Occ Pipe Smoker during that time.   Counseling given: Not Answered Tobacco comments: Occ Pipe Smoker during that time.   Outpatient Medications Prior to Visit  Medication Sig Dispense Refill   buPROPion (WELLBUTRIN SR) 150 MG 12 hr  tablet Take 1 tablet by mouth 2 (two) times daily.     cyclobenzaprine (FLEXERIL) 10 MG tablet Take 10 mg by mouth as needed for muscle spasms.     docusate sodium (COLACE) 100 MG capsule Take 100 mg by mouth 2 (two) times daily.     fish oil-omega-3 fatty acids 1000 MG capsule Take 3 g by mouth daily. Reported on 04/10/2015     fluticasone (FLONASE) 50 MCG/ACT nasal spray Place 1 spray into both nostrils daily.     furosemide (LASIX) 40 MG tablet Take 40 mg by mouth 2 (two) times daily. Take one tablet by mouth twice daily.      gabapentin (NEURONTIN) 300 MG capsule Take 300 mg by mouth 2 (two) times daily.     Glucosamine-Chondroitin 500-400 MG CAPS Take 2 capsules by mouth daily.      levocetirizine (XYZAL) 5 MG tablet Take by mouth.     losartan (COZAAR) 50 MG tablet TAKE 1 TABLET BY MOUTH DAILY (Patient taking differently: Take 50 mg by mouth daily.) 90 tablet 3   meloxicam (MOBIC) 15 MG tablet Take 15 mg by mouth daily.     montelukast (SINGULAIR) 10 MG tablet Take 10 mg by mouth at bedtime.     potassium chloride SA (KLOR-CON M) 20 MEQ tablet Take 20 mEq by mouth 2 (two) times daily.     prednisoLONE acetate (PRED FORTE) 1 % ophthalmic suspension Place 1 drop into the left eye daily.     temazepam (RESTORIL) 15 MG capsule Take by mouth at bedtime as needed.     calcium carbonate (OSCAL) 1500 (600 Ca) MG TABS tablet Take by mouth 2 (two) times daily with a meal.     metFORMIN (GLUCOPHAGE) 500 MG tablet Take 1 tablet (500 mg total) by mouth daily with breakfast 90 tablet 1   No facility-administered medications prior to visit.     Review of Systems:   Constitutional: No weight loss or gain, night sweats, fevers, chills, or lassitude. +fatigue  HEENT: No headaches, difficulty swallowing, tooth/dental problems, or sore throat. No sneezing, itching, ear ache. +occasional nasal congestion, post nasal drip CV:  No chest pain, orthopnea, PND, swelling in lower extremities, anasarca, dizziness, palpitations, syncope Resp: +snoring, shortness of breath with exertion (baseline for years). No excess mucus or change in color of mucus. No productive or non-productive. No hemoptysis. No wheezing.  No chest wall deformity GI:  No heartburn, indigestion, abdominal pain, nausea, vomiting, diarrhea, change in bowel habits, loss of appetite, bloody stools.  GU: No dysuria, change in color of urine, urgency or frequency.  No flank pain, no hematuria  Skin: No rash, lesions, ulcerations MSK:  +occasional joint stiffness. No joint  pain or swelling.  No decreased range of motion.  No back pain. Neuro: No dizziness or lightheadedness.  Psych: No depression or anxiety. Mood stable. +sleep disturbance    Physical Exam:  BP 108/74   Pulse (!) 112   Ht 6' (1.829 m)   Wt 276 lb 12.8 oz (125.6 kg)   SpO2 96%   BMI 37.54 kg/m   GEN: Pleasant, interactive, well-appearing; obese; in no acute distress. HEENT:  Normocephalic and atraumatic. PERRLA. Sclera white. Nasal turbinates pink, moist and patent bilaterally. No rhinorrhea present. Oropharynx pink and moist, without exudate or edema. No lesions, ulcerations, or postnasal drip. Mallampati II NECK:  Supple w/ fair ROM. No JVD present. Normal carotid impulses w/o bruits. Thyroid symmetrical with no goiter or nodules palpated. No lymphadenopathy.  CV: RRR, no m/r/g, no peripheral edema. Pulses intact, +2 bilaterally. No cyanosis, pallor or clubbing. PULMONARY:  Unlabored, regular breathing. Clear bilaterally A&P w/o wheezes/rales/rhonchi. No accessory muscle use.  GI: BS present and normoactive. Soft, non-tender to palpation. No organomegaly or masses detected.  MSK: No erythema, warmth or tenderness. Cap refil <2 sec all extrem. No deformities or joint swelling noted.  Neuro: A/Ox3. No focal deficits noted.   Skin: Warm, no lesions or rashe Psych: Normal affect and behavior. Judgement and thought content appropriate.     Lab Results:  CBC    Component Value Date/Time   WBC 6.0 07/22/2020 0958   RBC 4.00 (L) 07/22/2020 0958   HGB 13.6 07/22/2020 0958   HCT 39.1 07/22/2020 0958   PLT 305.0 07/22/2020 0958   MCV 97.8 07/22/2020 0958   MCH 33.8 07/27/2019 2331   MCHC 34.8 07/22/2020 0958   RDW 12.8 07/22/2020 0958   LYMPHSABS 3.6 07/27/2019 2331   MONOABS 0.8 07/27/2019 2331   EOSABS 0.4 07/27/2019 2331   BASOSABS 0.1 07/27/2019 2331    BMET    Component Value Date/Time   NA 141 07/22/2020 0958   K 4.2 07/22/2020 0958   CL 104 07/22/2020 0958   CO2 29  07/22/2020 0958   GLUCOSE 156 (H) 07/22/2020 0958   BUN 14 07/22/2020 0958   CREATININE 1.08 07/22/2020 0958   CALCIUM 9.6 07/22/2020 0958   GFRNONAA >60 07/27/2019 2331   GFRAA >60 07/27/2019 2331    BNP No results found for: "BNP"   Imaging:  No results found.        No data to display          No results found for: "NITRICOXIDE"      Assessment & Plan:   Complex sleep apnea syndrome He has a history of complex sleep apnea with primarily central events. He was treated with BiPAP therapy, which he has stopped using and has been off of for a few years. He has snoring, excessive daytime sleepiness, nocturnal gasping, nightmares. BMI 37. Epworth 8. Given this,  I am concerned he still has sleep disordered breathing with sleep apnea. He will need an in lab sleep study for further evaluation.    - discussed how weight can impact sleep and risk for sleep disordered breathing - discussed options to assist with weight loss: combination of diet modification, cardiovascular and strength training exercises   - had an extensive discussion regarding the adverse health consequences related to untreated sleep disordered breathing - specifically discussed the risks for hypertension, coronary artery disease, cardiac dysrhythmias, cerebrovascular disease, and diabetes - lifestyle modification discussed   - discussed how sleep disruption can increase risk of accidents, particularly when driving - safe driving practices were discussed   Obesity (BMI 30-39.9) BMI 37. Healthy weight loss encouraged.    I spent 35 minutes of dedicated to the care of this patient on the date of this encounter to include pre-visit review of records, face-to-face time with the patient discussing conditions above, post visit ordering of testing, clinical documentation with the electronic health record, making appropriate referrals as documented, and communicating necessary findings to members of the  patients care team.  Clayton Bibles, NP 11/19/2021  Pt aware and understands NP's role.  \

## 2021-11-19 NOTE — Patient Instructions (Signed)
Given your symptoms and history, I am concerned that you still have sleep disordered breathing with sleep apnea. I have ordered a sleep study for further evaluation. Someone will contact you to scheduled this.   We discussed how untreated sleep apnea puts an individual at risk for cardiac arrhthymias, pulm HTN, DM, stroke and increases their risk for daytime accidents. We also briefly reviewed treatment options including weight loss, side sleeping position, oral appliance, CPAP therapy or referral to ENT for possible surgical options  Follow up in 8-10 weeks with Dr. Ander Slade (new pt 30 min slot) to review sleep study results, or sooner if needed

## 2021-12-17 ENCOUNTER — Ambulatory Visit (HOSPITAL_BASED_OUTPATIENT_CLINIC_OR_DEPARTMENT_OTHER): Payer: Medicare Other | Attending: Nurse Practitioner | Admitting: Pulmonary Disease

## 2021-12-17 DIAGNOSIS — E669 Obesity, unspecified: Secondary | ICD-10-CM | POA: Diagnosis not present

## 2021-12-17 DIAGNOSIS — G4733 Obstructive sleep apnea (adult) (pediatric): Secondary | ICD-10-CM | POA: Diagnosis not present

## 2021-12-17 DIAGNOSIS — Z6839 Body mass index (BMI) 39.0-39.9, adult: Secondary | ICD-10-CM | POA: Diagnosis not present

## 2021-12-17 DIAGNOSIS — G4731 Primary central sleep apnea: Secondary | ICD-10-CM | POA: Insufficient documentation

## 2021-12-17 DIAGNOSIS — G4736 Sleep related hypoventilation in conditions classified elsewhere: Secondary | ICD-10-CM | POA: Diagnosis not present

## 2021-12-31 ENCOUNTER — Encounter: Payer: Self-pay | Admitting: *Deleted

## 2022-01-04 ENCOUNTER — Telehealth: Payer: Self-pay | Admitting: Pulmonary Disease

## 2022-01-04 DIAGNOSIS — G4731 Primary central sleep apnea: Secondary | ICD-10-CM

## 2022-01-04 NOTE — Telephone Encounter (Signed)
Call patient  Sleep study result  Date of study: 12/17/2021  Impression: Moderate obstructive sleep apnea Moderate oxygen desaturations  Recommendation: Recommend in- lab titration study for moderate obstructive sleep apnea  Given difficulty with tolerating BiPAP in the past, in lab study will be most appropriate  Follow-up as previously scheduled

## 2022-01-04 NOTE — Procedures (Signed)
POLYSOMNOGRAPHY  Last, First: Richard, Pollard MRN: [id number] Gender: Male Age (years): 33 Weight (lbs): 275 DOB: Sep 13, 1950 BMI: 39 Primary Care: No PCP Epworth Score: 8 Referring: Clayton Bibles NP Technician: Baxter Flattery Interpreting: Laurin Coder MD Study Type: NPSG Ordered Study Type: Split Night CPAP Study date: 12/17/2021 Location: [place] CLINICAL INFORMATION Richard Pollard is a 71 year old Male and was referred to the sleep center for evaluation of G47.33 OSA: Adult and Pediatric (327.23). Indications include Obesity, Snoring, Witnesses Apnea / Gasping During Sleep.  MEDICATIONS Patient self administered medications include: N/A. Medications administered during study include No sleep medicine administered.  SLEEP STUDY TECHNIQUE A multi-channel overnight Polysomnography study was performed. The channels recorded and monitored were central and occipital EEG, electrooculogram (EOG), submentalis EMG (chin), nasal and oral airflow, thoracic and abdominal wall motion, anterior tibialis EMG, snore microphone, electrocardiogram, and a pulse oximetry. TECHNICIAN COMMENTS Comments added by Technician: Patient had difficulty initiating sleep. Patient was restless all through the night. Comments added by Scorer: N/A SLEEP ARCHITECTURE The study was initiated at 10:24:38 PM and terminated at 4:40:52 AM. The total recorded time was 376.2 minutes. EEG confirmed total sleep time was 362.5 minutes yielding a sleep efficiency of 96.3%. Sleep onset after lights out was 5.7 minutes with a REM latency of 67.5 minutes. The patient spent 7.3% of the night in stage N1 sleep, 80.6% in stage N2 sleep, 0.0% in stage N3 and 12.1% in REM. Wake after sleep onset (WASO) was 8.0 minutes. The Arousal Index was 0.8/hour. RESPIRATORY PARAMETERS There were a total of 148 respiratory disturbances out of which 16 were apneas ( 5 obstructive, 0 mixed, 11 central) and 132 hypopneas. The apnea/hypopnea index (AHI)  was 24.5 events/hour. The central sleep apnea index was 1.8 events/hour. The REM AHI was 28.6 events/hour and NREM AHI was 23.9 events/hour. The supine AHI was 49.6 events/hour and the non supine AHI was 24.3 supine during 0.67% of sleep. Respiratory disturbances were associated with oxygen desaturation down to a nadir of 83.0% during sleep. The mean oxygen saturation during the study was 88.5%. The cumulative time under 88% oxygen saturation was 5.5 minutes.  LEG MOVEMENT DATA The total leg movements were 0 with a resulting leg movement index of 0.0/hr .Associated arousal with leg movement index was 0.0/hr.  CARDIAC DATA The underlying cardiac rhythm was most consistent with sinus rhythm. Mean heart rate during sleep was 80.1 bpm. Additional rhythm abnormalities include None.  IMPRESSIONS - Moderate Obstructive Sleep apnea(OSA) - EKG showed no cardiac abnormalities. - Moderate Oxygen Desaturation - The patient snored with moderate snoring volume. - No significant periodic leg movements(PLMs) during sleep. However, no significant associated arousals.  DIAGNOSIS - Obstructive Sleep Apnea (G47.33) - Nocturnal Hypoxemia (G47.36)  RECOMMENDATIONS - Therapeutic CPAP titration to determine optimal pressure required to alleviate sleep disordered breathing. - Given difficulty with tolerating BIPAP previosuly and Central sleep apnea events, an in-Lab titration is recommended. - Positional therapy avoiding supine position during sleep. - Avoid alcohol, sedatives and other CNS depressants that may worsen sleep apnea and disrupt normal sleep architecture. - Sleep hygiene should be reviewed to assess factors that may improve sleep quality. - Weight management and regular exercise should be initiated or continued.  [Electronically signed] 01/04/2022 09:24 AM  Sherrilyn Rist MD NPI: [id number]

## 2022-01-05 NOTE — Telephone Encounter (Signed)
I called and spoke with the pt and notified of results per Dr Jenetta Downer  Pt verbalized understanding and was okay with titration study  Order placed

## 2022-01-14 ENCOUNTER — Ambulatory Visit: Payer: Medicare Other | Admitting: Pulmonary Disease

## 2022-01-29 ENCOUNTER — Ambulatory Visit
Admission: RE | Admit: 2022-01-29 | Discharge: 2022-01-29 | Disposition: A | Discharge status: Home / Self Care | Payer: Medicare Other | Source: Ambulatory Visit | Attending: Physician Assistant | Admitting: Physician Assistant

## 2022-01-29 ENCOUNTER — Other Ambulatory Visit: Payer: Self-pay | Admitting: Physician Assistant

## 2022-01-29 DIAGNOSIS — R051 Acute cough: Secondary | ICD-10-CM

## 2022-02-02 ENCOUNTER — Ambulatory Visit (HOSPITAL_BASED_OUTPATIENT_CLINIC_OR_DEPARTMENT_OTHER): Payer: Medicare Other | Admitting: Pulmonary Disease

## 2022-02-15 ENCOUNTER — Ambulatory Visit: Payer: Medicare Other | Admitting: Pulmonary Disease

## 2022-12-03 LAB — COLOGUARD

## 2022-12-28 LAB — COLOGUARD: COLOGUARD: NEGATIVE

## 2023-02-01 ENCOUNTER — Other Ambulatory Visit (HOSPITAL_COMMUNITY): Payer: Self-pay

## 2023-02-01 MED ORDER — AMOXICILLIN 500 MG PO CAPS
500.0000 mg | ORAL_CAPSULE | Freq: Three times a day (TID) | ORAL | 1 refills | Status: AC
  Filled 2023-02-01: qty 30, 10d supply, fill #0

## 2023-02-18 ENCOUNTER — Other Ambulatory Visit (HOSPITAL_COMMUNITY): Payer: Self-pay

## 2023-02-21 ENCOUNTER — Other Ambulatory Visit (HOSPITAL_COMMUNITY): Payer: Self-pay

## 2023-02-21 MED ORDER — GABAPENTIN 300 MG PO CAPS
300.0000 mg | ORAL_CAPSULE | Freq: Two times a day (BID) | ORAL | 0 refills | Status: DC
  Filled 2023-02-21: qty 180, 90d supply, fill #0

## 2023-02-21 MED ORDER — BUPROPION HCL ER (SR) 150 MG PO TB12
150.0000 mg | ORAL_TABLET | Freq: Two times a day (BID) | ORAL | 0 refills | Status: DC
  Filled 2023-02-21: qty 180, 90d supply, fill #0

## 2023-02-22 ENCOUNTER — Other Ambulatory Visit (HOSPITAL_COMMUNITY): Payer: Self-pay

## 2023-03-07 ENCOUNTER — Other Ambulatory Visit (HOSPITAL_COMMUNITY): Payer: Self-pay

## 2023-03-07 MED ORDER — MONTELUKAST SODIUM 10 MG PO TABS
10.0000 mg | ORAL_TABLET | Freq: Every evening | ORAL | 0 refills | Status: DC
  Filled 2023-03-07: qty 90, 90d supply, fill #0

## 2023-03-09 ENCOUNTER — Other Ambulatory Visit (HOSPITAL_COMMUNITY): Payer: Self-pay

## 2023-03-11 ENCOUNTER — Other Ambulatory Visit (HOSPITAL_COMMUNITY): Payer: Self-pay

## 2023-03-11 ENCOUNTER — Other Ambulatory Visit (HOSPITAL_BASED_OUTPATIENT_CLINIC_OR_DEPARTMENT_OTHER): Payer: Self-pay

## 2023-03-11 MED ORDER — POTASSIUM CHLORIDE CRYS ER 20 MEQ PO TBCR
20.0000 meq | EXTENDED_RELEASE_TABLET | Freq: Two times a day (BID) | ORAL | 0 refills | Status: DC
  Filled 2023-03-11: qty 180, 90d supply, fill #0

## 2023-05-10 ENCOUNTER — Other Ambulatory Visit (HOSPITAL_COMMUNITY): Payer: Self-pay

## 2023-05-10 MED ORDER — MOUNJARO 2.5 MG/0.5ML ~~LOC~~ SOAJ
2.5000 mg | SUBCUTANEOUS | 0 refills | Status: DC
Start: 2023-05-10 — End: 2023-06-07
  Filled 2023-05-10 – 2023-05-11 (×2): qty 2, 28d supply, fill #0

## 2023-05-10 MED ORDER — METFORMIN HCL 500 MG PO TABS
500.0000 mg | ORAL_TABLET | Freq: Two times a day (BID) | ORAL | 1 refills | Status: DC
  Filled 2023-05-10: qty 180, 90d supply, fill #0
  Filled 2023-09-20: qty 180, 90d supply, fill #1

## 2023-05-11 ENCOUNTER — Other Ambulatory Visit (HOSPITAL_COMMUNITY): Payer: Self-pay

## 2023-05-23 ENCOUNTER — Other Ambulatory Visit (HOSPITAL_COMMUNITY): Payer: Self-pay

## 2023-05-23 MED ORDER — GABAPENTIN 300 MG PO CAPS
300.0000 mg | ORAL_CAPSULE | Freq: Two times a day (BID) | ORAL | 0 refills | Status: DC
  Filled 2023-05-23: qty 180, 90d supply, fill #0

## 2023-05-23 MED ORDER — BUPROPION HCL ER (SR) 150 MG PO TB12
150.0000 mg | ORAL_TABLET | Freq: Two times a day (BID) | ORAL | 0 refills | Status: DC
  Filled 2023-05-23: qty 180, 90d supply, fill #0

## 2023-05-23 MED ORDER — MONTELUKAST SODIUM 10 MG PO TABS
10.0000 mg | ORAL_TABLET | Freq: Every day | ORAL | 0 refills | Status: DC
  Filled 2023-05-23: qty 90, 90d supply, fill #0

## 2023-05-24 ENCOUNTER — Other Ambulatory Visit (HOSPITAL_COMMUNITY): Payer: Self-pay

## 2023-06-07 ENCOUNTER — Other Ambulatory Visit (HOSPITAL_COMMUNITY): Payer: Self-pay

## 2023-06-07 MED ORDER — POTASSIUM CHLORIDE CRYS ER 20 MEQ PO TBCR
20.0000 meq | EXTENDED_RELEASE_TABLET | Freq: Two times a day (BID) | ORAL | 0 refills | Status: DC
  Filled 2023-06-07: qty 180, 90d supply, fill #0

## 2023-06-07 MED ORDER — MOUNJARO 2.5 MG/0.5ML ~~LOC~~ SOAJ
2.5000 mg | SUBCUTANEOUS | 0 refills | Status: DC
  Filled 2023-06-07: qty 2, 28d supply, fill #0

## 2023-06-22 ENCOUNTER — Other Ambulatory Visit (HOSPITAL_COMMUNITY): Payer: Self-pay

## 2023-06-22 MED ORDER — LOSARTAN POTASSIUM 50 MG PO TABS
50.0000 mg | ORAL_TABLET | Freq: Every day | ORAL | 0 refills | Status: DC
  Filled 2023-06-22: qty 90, 90d supply, fill #0

## 2023-06-22 MED ORDER — MELOXICAM 15 MG PO TABS
15.0000 mg | ORAL_TABLET | Freq: Every day | ORAL | 0 refills | Status: AC
  Filled 2023-06-22: qty 90, 90d supply, fill #0

## 2023-06-28 ENCOUNTER — Other Ambulatory Visit (HOSPITAL_COMMUNITY): Payer: Self-pay

## 2023-06-28 MED ORDER — FUROSEMIDE 40 MG PO TABS
40.0000 mg | ORAL_TABLET | Freq: Two times a day (BID) | ORAL | 0 refills | Status: AC
  Filled 2023-06-28: qty 180, 90d supply, fill #0

## 2023-07-04 ENCOUNTER — Other Ambulatory Visit (HOSPITAL_COMMUNITY): Payer: Self-pay

## 2023-07-04 MED ORDER — MOUNJARO 2.5 MG/0.5ML ~~LOC~~ SOAJ
2.5000 mg | SUBCUTANEOUS | 0 refills | Status: DC
  Filled 2023-07-04: qty 2, 28d supply, fill #0

## 2023-08-03 ENCOUNTER — Other Ambulatory Visit (HOSPITAL_COMMUNITY): Payer: Self-pay

## 2023-08-03 MED ORDER — MOUNJARO 2.5 MG/0.5ML ~~LOC~~ SOAJ
2.5000 mg | SUBCUTANEOUS | 0 refills | Status: DC
  Filled 2023-08-03: qty 2, 28d supply, fill #0

## 2023-08-22 ENCOUNTER — Other Ambulatory Visit (HOSPITAL_COMMUNITY): Payer: Self-pay

## 2023-08-22 MED ORDER — GABAPENTIN 300 MG PO CAPS
300.0000 mg | ORAL_CAPSULE | Freq: Two times a day (BID) | ORAL | 0 refills | Status: AC
  Filled 2023-08-22: qty 180, 90d supply, fill #0

## 2023-08-22 MED ORDER — BUPROPION HCL ER (SR) 150 MG PO TB12
150.0000 mg | ORAL_TABLET | Freq: Two times a day (BID) | ORAL | 0 refills | Status: DC
  Filled 2023-08-22 (×2): qty 180, 90d supply, fill #0

## 2023-09-01 ENCOUNTER — Other Ambulatory Visit (HOSPITAL_COMMUNITY): Payer: Self-pay

## 2023-09-01 MED ORDER — MOUNJARO 2.5 MG/0.5ML ~~LOC~~ SOAJ
2.5000 mg | SUBCUTANEOUS | 0 refills | Status: DC
  Filled 2023-09-01: qty 2, 28d supply, fill #0

## 2023-09-01 MED ORDER — MONTELUKAST SODIUM 10 MG PO TABS
10.0000 mg | ORAL_TABLET | Freq: Every day | ORAL | 0 refills | Status: DC
  Filled 2023-09-01: qty 90, 90d supply, fill #0

## 2023-09-01 MED ORDER — POTASSIUM CHLORIDE CRYS ER 20 MEQ PO TBCR
20.0000 meq | EXTENDED_RELEASE_TABLET | Freq: Two times a day (BID) | ORAL | 0 refills | Status: DC
  Filled 2023-09-01: qty 180, 90d supply, fill #0

## 2023-09-20 ENCOUNTER — Other Ambulatory Visit (HOSPITAL_COMMUNITY): Payer: Self-pay

## 2023-09-22 ENCOUNTER — Other Ambulatory Visit (HOSPITAL_COMMUNITY): Payer: Self-pay

## 2023-09-22 MED ORDER — LOSARTAN POTASSIUM 50 MG PO TABS
50.0000 mg | ORAL_TABLET | Freq: Every day | ORAL | 0 refills | Status: DC
  Filled 2023-09-22: qty 90, 90d supply, fill #0

## 2023-09-28 ENCOUNTER — Other Ambulatory Visit (HOSPITAL_COMMUNITY): Payer: Self-pay

## 2023-09-28 MED ORDER — MELOXICAM 15 MG PO TABS
15.0000 mg | ORAL_TABLET | Freq: Every day | ORAL | 0 refills | Status: DC
  Filled 2023-09-28: qty 90, 90d supply, fill #0

## 2023-09-28 MED ORDER — FUROSEMIDE 40 MG PO TABS
40.0000 mg | ORAL_TABLET | Freq: Two times a day (BID) | ORAL | 0 refills | Status: DC
  Filled 2023-09-28: qty 180, 90d supply, fill #0

## 2023-09-28 MED ORDER — DOXYCYCLINE HYCLATE 100 MG PO CAPS
100.0000 mg | ORAL_CAPSULE | Freq: Two times a day (BID) | ORAL | 0 refills | Status: AC
  Filled 2023-09-28: qty 28, 14d supply, fill #0

## 2023-09-28 MED ORDER — MOUNJARO 2.5 MG/0.5ML ~~LOC~~ SOAJ
2.5000 mg | SUBCUTANEOUS | 3 refills | Status: DC
  Filled 2023-09-28: qty 2, 28d supply, fill #0
  Filled 2023-10-26: qty 2, 28d supply, fill #1

## 2023-09-30 ENCOUNTER — Other Ambulatory Visit (HOSPITAL_COMMUNITY): Payer: Self-pay

## 2023-09-30 MED ORDER — PREDNISONE 10 MG (21) PO TBPK
ORAL_TABLET | ORAL | 0 refills | Status: AC
  Filled 2023-09-30: qty 21, 6d supply, fill #0

## 2023-10-13 ENCOUNTER — Other Ambulatory Visit (HOSPITAL_COMMUNITY): Payer: Self-pay

## 2023-10-13 MED ORDER — ATORVASTATIN CALCIUM 40 MG PO TABS
40.0000 mg | ORAL_TABLET | Freq: Every day | ORAL | 3 refills | Status: AC
  Filled 2023-10-13: qty 90, 90d supply, fill #0
  Filled 2024-01-09: qty 90, 90d supply, fill #1

## 2023-11-09 ENCOUNTER — Other Ambulatory Visit (HOSPITAL_COMMUNITY): Payer: Self-pay

## 2023-11-09 ENCOUNTER — Other Ambulatory Visit: Payer: Self-pay

## 2023-11-09 MED ORDER — MONTELUKAST SODIUM 10 MG PO TABS
10.0000 mg | ORAL_TABLET | Freq: Every evening | ORAL | 0 refills | Status: AC
  Filled 2023-11-09: qty 90, 90d supply, fill #0

## 2023-11-09 MED ORDER — POTASSIUM CHLORIDE CRYS ER 20 MEQ PO TBCR
20.0000 meq | EXTENDED_RELEASE_TABLET | Freq: Two times a day (BID) | ORAL | 0 refills | Status: AC
  Filled 2023-11-09: qty 180, 90d supply, fill #0

## 2023-11-09 MED ORDER — FUROSEMIDE 40 MG PO TABS
40.0000 mg | ORAL_TABLET | Freq: Two times a day (BID) | ORAL | 0 refills | Status: DC
  Filled 2023-11-09 – 2024-01-09 (×2): qty 180, 90d supply, fill #0

## 2023-11-09 MED ORDER — MOUNJARO 7.5 MG/0.5ML ~~LOC~~ SOAJ
7.5000 mg | SUBCUTANEOUS | 0 refills | Status: DC
  Filled 2024-01-16: qty 2, 28d supply, fill #0

## 2023-11-09 MED ORDER — MOUNJARO 5 MG/0.5ML ~~LOC~~ SOAJ
5.0000 mg | SUBCUTANEOUS | 0 refills | Status: DC
  Filled 2023-11-09: qty 2, 28d supply, fill #0

## 2023-11-09 MED ORDER — GABAPENTIN 100 MG PO CAPS
ORAL_CAPSULE | ORAL | 0 refills | Status: DC
  Filled 2023-11-09: qty 49, 21d supply, fill #0

## 2023-11-09 MED ORDER — ATORVASTATIN CALCIUM 40 MG PO TABS
40.0000 mg | ORAL_TABLET | Freq: Every day | ORAL | 3 refills | Status: AC
  Filled 2023-11-09: qty 90, 90d supply, fill #0

## 2023-11-09 MED ORDER — LOSARTAN POTASSIUM 50 MG PO TABS
50.0000 mg | ORAL_TABLET | Freq: Every day | ORAL | 0 refills | Status: AC
  Filled 2023-11-09 – 2023-12-22 (×2): qty 90, 90d supply, fill #0

## 2023-11-09 MED ORDER — BUPROPION HCL ER (SR) 150 MG PO TB12
150.0000 mg | ORAL_TABLET | Freq: Two times a day (BID) | ORAL | 0 refills | Status: DC
  Filled 2023-11-09: qty 180, 90d supply, fill #0

## 2023-11-09 MED ORDER — CYCLOBENZAPRINE HCL 10 MG PO TABS
10.0000 mg | ORAL_TABLET | Freq: Every evening | ORAL | 2 refills | Status: AC
  Filled 2023-11-09: qty 30, 30d supply, fill #0

## 2023-11-09 MED ORDER — METFORMIN HCL 500 MG PO TABS
500.0000 mg | ORAL_TABLET | Freq: Two times a day (BID) | ORAL | 1 refills | Status: AC
  Filled 2023-11-09 – 2024-02-01 (×2): qty 180, 90d supply, fill #0

## 2023-12-22 ENCOUNTER — Other Ambulatory Visit (HOSPITAL_COMMUNITY): Payer: Self-pay

## 2023-12-22 MED ORDER — MOUNJARO 5 MG/0.5ML ~~LOC~~ SOAJ
5.0000 mg | SUBCUTANEOUS | 0 refills | Status: DC
  Filled 2023-12-22: qty 2, 28d supply, fill #0

## 2023-12-24 ENCOUNTER — Other Ambulatory Visit (HOSPITAL_COMMUNITY): Payer: Self-pay

## 2023-12-24 MED ORDER — PREDNISOLONE ACETATE 1 % OP SUSP
1.0000 [drp] | Freq: Every day | OPHTHALMIC | 11 refills | Status: AC
  Filled 2023-12-24: qty 10, 200d supply, fill #0

## 2024-01-03 ENCOUNTER — Other Ambulatory Visit (HOSPITAL_COMMUNITY): Payer: Self-pay

## 2024-01-03 MED ORDER — MELOXICAM 15 MG PO TABS
15.0000 mg | ORAL_TABLET | Freq: Every day | ORAL | 0 refills | Status: DC
  Filled 2024-01-03: qty 90, 90d supply, fill #0

## 2024-01-04 ENCOUNTER — Other Ambulatory Visit (HOSPITAL_COMMUNITY): Payer: Self-pay

## 2024-01-09 ENCOUNTER — Other Ambulatory Visit: Payer: Self-pay

## 2024-01-09 ENCOUNTER — Other Ambulatory Visit (HOSPITAL_COMMUNITY): Payer: Self-pay

## 2024-01-16 ENCOUNTER — Other Ambulatory Visit (HOSPITAL_COMMUNITY): Payer: Self-pay

## 2024-02-01 ENCOUNTER — Other Ambulatory Visit (HOSPITAL_COMMUNITY): Payer: Self-pay

## 2024-02-01 ENCOUNTER — Other Ambulatory Visit: Payer: Self-pay

## 2024-02-01 MED ORDER — MELOXICAM 15 MG PO TABS
15.0000 mg | ORAL_TABLET | Freq: Every day | ORAL | 0 refills | Status: AC
  Filled 2024-02-01: qty 90, 90d supply, fill #0

## 2024-02-02 ENCOUNTER — Other Ambulatory Visit (HOSPITAL_COMMUNITY): Payer: Self-pay

## 2024-02-09 ENCOUNTER — Other Ambulatory Visit (HOSPITAL_COMMUNITY): Payer: Self-pay

## 2024-02-09 ENCOUNTER — Other Ambulatory Visit: Payer: Self-pay

## 2024-02-09 MED ORDER — POTASSIUM CHLORIDE CRYS ER 20 MEQ PO TBCR
20.0000 meq | EXTENDED_RELEASE_TABLET | Freq: Every day | ORAL | 1 refills | Status: AC
  Filled 2024-02-09: qty 90, 90d supply, fill #0

## 2024-02-09 MED ORDER — DULOXETINE HCL 60 MG PO CPEP
60.0000 mg | ORAL_CAPSULE | Freq: Every evening | ORAL | 1 refills | Status: AC
  Filled 2024-02-09: qty 30, 30d supply, fill #0

## 2024-02-09 MED ORDER — FUROSEMIDE 20 MG PO TABS
20.0000 mg | ORAL_TABLET | Freq: Two times a day (BID) | ORAL | 3 refills | Status: AC
  Filled 2024-02-09: qty 60, 30d supply, fill #0

## 2024-02-10 ENCOUNTER — Other Ambulatory Visit (HOSPITAL_COMMUNITY): Payer: Self-pay

## 2024-02-10 MED ORDER — MOUNJARO 10 MG/0.5ML ~~LOC~~ SOAJ
10.0000 mg | SUBCUTANEOUS | 3 refills | Status: AC
  Filled 2024-02-10: qty 2, 28d supply, fill #0

## 2024-02-16 ENCOUNTER — Other Ambulatory Visit (HOSPITAL_COMMUNITY): Payer: Self-pay

## 2024-02-16 MED ORDER — BUPROPION HCL ER (SR) 150 MG PO TB12
150.0000 mg | ORAL_TABLET | Freq: Two times a day (BID) | ORAL | 0 refills | Status: AC
  Filled 2024-02-16: qty 180, 90d supply, fill #0
